# Patient Record
Sex: Female | Born: 1965 | Race: Black or African American | Hispanic: No | Marital: Single | Smoking: Current every day smoker
Health system: Southern US, Community
[De-identification: ages and names within clinical notes are randomized; demographics above are authoritative.]

## PROBLEM LIST (undated history)

## (undated) DIAGNOSIS — I10 Essential (primary) hypertension: Secondary | ICD-10-CM

## (undated) DIAGNOSIS — F32A Depression, unspecified: Secondary | ICD-10-CM

## (undated) DIAGNOSIS — F329 Major depressive disorder, single episode, unspecified: Secondary | ICD-10-CM

## (undated) DIAGNOSIS — M1990 Unspecified osteoarthritis, unspecified site: Secondary | ICD-10-CM

## (undated) HISTORY — DX: Major depressive disorder, single episode, unspecified: F32.9

## (undated) HISTORY — DX: Essential (primary) hypertension: I10

## (undated) HISTORY — DX: Unspecified osteoarthritis, unspecified site: M19.90

## (undated) HISTORY — DX: Depression, unspecified: F32.A

---

## 2001-12-28 HISTORY — PX: TUBAL LIGATION: SHX77

## 2012-01-09 ENCOUNTER — Encounter (HOSPITAL_COMMUNITY): Payer: Self-pay | Admitting: *Deleted

## 2012-01-09 ENCOUNTER — Emergency Department (INDEPENDENT_AMBULATORY_CARE_PROVIDER_SITE_OTHER)
Admission: EM | Admit: 2012-01-09 | Discharge: 2012-01-09 | Disposition: A | Discharge status: Home / Self Care | Payer: Self-pay | Source: Home / Self Care | Attending: Emergency Medicine | Admitting: Emergency Medicine

## 2012-01-09 DIAGNOSIS — I10 Essential (primary) hypertension: Secondary | ICD-10-CM

## 2012-01-09 MED ORDER — HYDROCHLOROTHIAZIDE 12.5 MG PO TABS: 25.0000 mg | ORAL_TABLET | Freq: Every day | ORAL | Status: DC

## 2012-01-09 MED ORDER — HYDROCHLOROTHIAZIDE 25 MG PO TABS: 25.0000 mg | ORAL_TABLET | Freq: Every day | ORAL | Status: DC

## 2012-01-09 NOTE — ED Notes (Signed)
Reports having high BP over last 2 days when attempting to go to [facility] to donate.  Denies hx HTN.  Has first appt at [facility] on 01/19/12.  C/O frequent HAs recently.

## 2012-01-09 NOTE — ED Provider Notes (Signed)
History     CSN: [id number]  Arrival date & time 01/09/12  1018   First MD Initiated Contact with Patient 01/09/12 1027      Chief Complaint  Patient presents with  . Hypertension    (Consider location/radiation/quality/duration/timing/severity/associated sxs/prior treatment) HPI Comments: [facility], that my blood pressure is high" I have an appointment to see a PCP, but didn't want to wait that long cause my BP has been high, have seen it 175/102 and more" ' i also been getting these  headaches"  Patient is a 46 y.o. female presenting with hypertension. The history is provided by the patient. No language interpreter was used.  Hypertension This is a recurrent problem. The problem occurs constantly. The problem has not changed since onset.Associated symptoms include headaches. Pertinent negatives include no chest pain, no abdominal pain and no shortness of breath. The symptoms are aggravated by nothing. The symptoms are relieved by nothing. She has tried nothing for the symptoms.    History reviewed. No pertinent past medical history.  History reviewed. No pertinent past surgical history.  No family history on file.  History  Substance Use Topics  . Smoking status: Current Everyday Smoker -- 1.0 packs/day  . Smokeless tobacco: Not on file  . Alcohol Use:     OB History    Grav Para Term Preterm Abortions TAB SAB Ect Mult Living                  Review of Systems  Constitutional: Negative for fever and fatigue.  Respiratory: Negative for shortness of breath.   Cardiovascular: Negative for chest pain.  Gastrointestinal: Negative for abdominal pain.  Neurological: Positive for headaches. Negative for dizziness, syncope, facial asymmetry, speech difficulty, weakness, light-headedness and numbness.    Allergies  Review of patient's allergies indicates no known allergies.  Home Medications   Current Outpatient Rx  Name Route Sig  Dispense Refill  . HYDROCHLOROTHIAZIDE 25 MG PO TABS Oral Take 1 tablet (25 mg total) by mouth daily. 30 tablet 0    BP 159/99  Pulse 89  Temp(Src) 98 F (36.7 C) (Oral)  Resp 16  SpO2 100%  Physical Exam  Nursing note and vitals reviewed. Constitutional: She appears well-developed and well-nourished. No distress.  HENT:  Head: Normocephalic.  Eyes: Conjunctivae are normal.  Neck: Normal range of motion. Neck supple. No JVD present.  Cardiovascular: Normal rate, regular rhythm, normal heart sounds and intact distal pulses.  Exam reveals no gallop and no friction rub.   No murmur heard. Pulmonary/Chest: Effort normal and breath sounds normal. No respiratory distress. She has no decreased breath sounds. She has no wheezes. She has no rhonchi. She has no rales. She exhibits no tenderness.  Abdominal: Soft. Bowel sounds are normal. She exhibits no distension and no mass. There is no tenderness. There is no rebound and no guarding.  Lymphadenopathy:    She has no cervical adenopathy.  Neurological: She is alert. She has normal strength. She displays no atrophy and no tremor. No cranial nerve deficit or sensory deficit. She exhibits normal muscle tone.    ED Course  Procedures (including critical care time)  Labs Reviewed - No data to display No results found.   1. Hypertension       MDM  Recurrent HTN        Jimmie Molly, MD 01/09/12 1501

## 2012-03-22 ENCOUNTER — Encounter: Payer: Self-pay | Admitting: Gastroenterology

## 2012-03-22 ENCOUNTER — Other Ambulatory Visit (HOSPITAL_COMMUNITY): Payer: Self-pay | Admitting: Family Medicine

## 2012-03-22 DIAGNOSIS — N939 Abnormal uterine and vaginal bleeding, unspecified: Secondary | ICD-10-CM

## 2012-04-01 ENCOUNTER — Other Ambulatory Visit (HOSPITAL_COMMUNITY): Payer: Self-pay

## 2012-04-01 ENCOUNTER — Inpatient Hospital Stay (HOSPITAL_COMMUNITY): Admission: RE | Admit: 2012-04-01 | Payer: Self-pay | Source: Ambulatory Visit

## 2012-04-05 ENCOUNTER — Ambulatory Visit (HOSPITAL_COMMUNITY)
Admission: RE | Admit: 2012-04-05 | Discharge: 2012-04-05 | Disposition: A | Discharge status: Home / Self Care | Payer: Self-pay | Source: Ambulatory Visit | Attending: Family Medicine | Admitting: Family Medicine

## 2012-04-05 ENCOUNTER — Ambulatory Visit (AMBULATORY_SURGERY_CENTER): Payer: Self-pay | Admitting: *Deleted

## 2012-04-05 VITALS — Ht 65.5 in | Wt 170.3 lb

## 2012-04-05 DIAGNOSIS — N950 Postmenopausal bleeding: Secondary | ICD-10-CM | POA: Insufficient documentation

## 2012-04-05 DIAGNOSIS — Z1211 Encounter for screening for malignant neoplasm of colon: Secondary | ICD-10-CM

## 2012-04-05 DIAGNOSIS — N939 Abnormal uterine and vaginal bleeding, unspecified: Secondary | ICD-10-CM

## 2012-04-05 DIAGNOSIS — N83209 Unspecified ovarian cyst, unspecified side: Secondary | ICD-10-CM | POA: Insufficient documentation

## 2012-04-05 MED ORDER — PEG-KCL-NACL-NASULF-NA ASC-C 100 G PO SOLR: ORAL | Status: DC

## 2012-04-05 NOTE — Progress Notes (Signed)
Information on how to pay her pill and Moviprep voucher given to pt

## 2012-04-19 ENCOUNTER — Encounter: Payer: Self-pay | Admitting: Gastroenterology

## 2012-04-27 ENCOUNTER — Ambulatory Visit (INDEPENDENT_AMBULATORY_CARE_PROVIDER_SITE_OTHER): Payer: Self-pay | Admitting: Obstetrics & Gynecology

## 2012-04-27 ENCOUNTER — Encounter: Payer: Self-pay | Admitting: Obstetrics & Gynecology

## 2012-04-27 ENCOUNTER — Other Ambulatory Visit (HOSPITAL_COMMUNITY)
Admission: RE | Admit: 2012-04-27 | Discharge: 2012-04-27 | Disposition: A | Discharge status: Home / Self Care | Payer: Self-pay | Source: Ambulatory Visit | Attending: Obstetrics & Gynecology | Admitting: Obstetrics & Gynecology

## 2012-04-27 VITALS — BP 142/93 | HR 71 | Temp 97.5°F | Resp 20 | Ht 65.5 in | Wt 173.6 lb

## 2012-04-27 DIAGNOSIS — D219 Benign neoplasm of connective and other soft tissue, unspecified: Secondary | ICD-10-CM

## 2012-04-27 DIAGNOSIS — N926 Irregular menstruation, unspecified: Secondary | ICD-10-CM

## 2012-04-27 DIAGNOSIS — N939 Abnormal uterine and vaginal bleeding, unspecified: Secondary | ICD-10-CM | POA: Insufficient documentation

## 2012-04-27 DIAGNOSIS — Z01812 Encounter for preprocedural laboratory examination: Secondary | ICD-10-CM

## 2012-04-27 DIAGNOSIS — D259 Leiomyoma of uterus, unspecified: Secondary | ICD-10-CM

## 2012-04-27 NOTE — Progress Notes (Signed)
Addended by: Jaynie Collins A on: 04/27/2012 05:13 PM   Modules accepted: Orders

## 2012-04-27 NOTE — Patient Instructions (Signed)
Postmenopausal Bleeding Menopause is commonly referred to as the "change in life." It is a time when the fertile years, the time of ovulating and having menstrual periods, has come to an end. It is also determined by not having menstrual periods for 12 months.  Postmenopausal bleeding is any bleeding a woman has after she has entered into menopause. Any type of postmenopausal bleeding, even if it appears to be a typical menstrual period, is concerning. This should be evaluated by your caregiver.  CAUSES   Hormone therapy.   Cancer of the cervix or cancer of the lining of the uterus (endometrial cancer).   Thinning of the uterine lining (uterine atrophy).   Thyroid diseases.   Certain medicines.   Infection of the uterus or cervix.   Inflammation or irritation of the uterine lining (endometritis).   Estrogen-secreting tumors.   Growths (polyps) on the cervix, uterine lining, or uterus.   Uterine tumors (fibroids).   Being very overweight (obese).  DIAGNOSIS  Your caregiver will take a medical history and ask questions. A physical exam will also be performed. Further tests may include:   A transvaginal ultrasound. An ultrasound wand or probe is inserted into your vagina to view the pelvic organs.   A biopsy of the lining of the uterus (endometrium). A sample of the endometrium is removed and examined.   A hysteroscopy. Your caregiver may use an instrument with a light and a camera attached to it (hysteroscope). The hysteroscope is used to look inside the uterus for problems.   A dilation and curettage (D&C). Tissue is removed from the uterine lining to be examined for problems.  TREATMENT  Treatment depends on the cause of the bleeding. Some treatments include:   Surgery.   Medicines.   Hormones.   A hysteroscopy or D&C to remove polyps or fibroids.   Changing or stopping a current medicine you are taking.  Talk to your caregiver about your specific treatment. HOME CARE  INSTRUCTIONS   Maintain a healthy weight.   Keep regular pelvic exams and Pap tests.  SEEK MEDICAL CARE IF:   You have bleeding, even if it is light in comparison to your previous periods.   Your bleeding lasts more than 1 week.   You have abdominal pain.   You develop bleeding with sexual intercourse.  SEEK IMMEDIATE MEDICAL CARE IF:   You have a fever, chills, headache, dizziness, muscle aches, and bleeding.   You have severe pain with bleeding.   You are passing blood clots.   You have bleeding and need more than 1 pad an hour.   You feel faint.  MAKE SURE YOU:  Understand these instructions.   Will watch your condition.   Will get help right away if you are not doing well or get worse.  Document Released: 03/24/2006 Document Revised: 12/03/2011 Document Reviewed: 08/20/2011 [employer] Patient Information 2012 [employer]. 

## 2012-04-27 NOTE — Progress Notes (Signed)
.  Endometrial Biopsy Procedure Note  History of Present Illness:Patient reports having one year without bleeding, then period resumed in 02/2011 and lasted 7 days.  In April, just had spotting.  No other symptoms. Wants to make sure everything is okay.  04/05/2012 TRANSABDOMINAL AND TRANSVAGINAL ULTRASOUND OF PELVIS Clinical Data: Postmenopausal bleeding. LMP was greater than 1 year ago.  Findings:  Uterus: Is anteverted and measures 8.6 x 4.4 x 4.5 cm.  The uterus is diffusely heterogeneous.  In the left lower uterine segment is an intramural 2.2 x 1.6 x 1.6 cm fibroid.  In the right aspect of the uterine fundus is a 2.6 x 2.3 x 1.9 cm uterine fibroid.  There is diffuse heterogeneity in the posterior uterine body which is more ill-defined and is associated with some linear shadowing. This area measures 3.7 x 2.7 x 3.7 cm and could reflect a fibroid or focal adenomyosis.  The anterior margin of this abnormality is submucosal.  Endometrium: The endometrium is not well visualized.  The junctional zone between the endometrium and myometrium is indistinct and shadowing from the fibroids and/or adenomyosis makes evaluation difficult.  The endometrium is estimated to be between 5 and 8 mm.  Right ovary:  The right ovary measures 2.3 x 2.0 x 1.0 cm and contains a 1.6 x 1.6 x 2.1 cm long unilocular simple cyst versus dominant follicle.  Left ovary: The left ovary measures 2.7 x 1.7 x 1.6 cm and contains a complex cyst with thin internal bands that measures 1.4 x 1.0 x 1.3 cm.  Other findings: No free fluid  IMPRESSION: 1. The uterus contains at least two distinct fibroids as well as a larger indistinct heterogeneous area in the posterior uterine body with a submucosal component; fibroid versus focal adenomyosis. 2.  Precise measurements of the endometrium are difficult, as described above.  Endometrial thickness is estimated to be between 5-8 mm.  3.  1.6 cm unilocular cyst in the right ovary.  4.  1.4 cm mildly complex  cyst in the left ovary. This complex cyst has appearance suggestive of a hemorrhagic cyst.  A follow-up pelvic ultrasound in 3 months is suggested.  Original Report Authenticated By: Britta Mccreedy, M.D.   Indications: abnormal uterine bleeding, postmenopausal bleeding  Procedure Details  Urine pregnancy test was done today and result was negative.  The risks (including infection, bleeding, pain, and uterine perforation) and benefits of the procedure were explained to the patient and Written informed consent was obtained.  The patient was placed in the dorsal lithotomy position.  Bimanual exam showed the uterus to be in the anteroflexed position.  A Graves' speculum inserted in the vagina, and the cervix prepped with povidone iodine. A sharp tenaculum was applied to the anterior lip of the cervix for stabilization.  A sterile uterine sound was used to sound the uterus to a depth of 7cm.  A Mylex 3mm curette was used to sample the endometrium.  Sample was sent for pathologic examination.  Condition: Stable  Complications: None  Plan: The patient was advised to call for any fever or for prolonged or severe pain or bleeding. She was advised to use NSAID as needed for mild to moderate pain. She was advised to avoid vaginal intercourse for 48 hours or until the bleeding has completely stopped.  She was return to discuss results. Bleeding precautions reviewed.

## 2012-05-04 ENCOUNTER — Other Ambulatory Visit: Payer: Self-pay | Admitting: Obstetrics & Gynecology

## 2012-05-04 ENCOUNTER — Ambulatory Visit (HOSPITAL_COMMUNITY)
Admission: RE | Admit: 2012-05-04 | Discharge: 2012-05-04 | Disposition: A | Discharge status: Home / Self Care | Payer: Self-pay | Source: Ambulatory Visit | Attending: Obstetrics & Gynecology | Admitting: Obstetrics & Gynecology

## 2012-05-04 DIAGNOSIS — Z1231 Encounter for screening mammogram for malignant neoplasm of breast: Secondary | ICD-10-CM

## 2012-05-06 ENCOUNTER — Encounter: Payer: Self-pay | Admitting: Gastroenterology

## 2012-05-11 ENCOUNTER — Telehealth: Payer: Self-pay | Admitting: *Deleted

## 2012-05-11 NOTE — Telephone Encounter (Signed)
Lailani called and left a message requesting results- called [facility]- had endometrial biopsy for postmenopausal bleeding- informed her per chart review Dr. Macon Large had planned for a results appointment- but did tell patient her biopsy was negative for cancer, but may still need to meet with doctor to discuss plan of care.  Patient also c/o still spotting and pain in ovary where another doctor told her was filled with blood per ultrasound- informed her would need to schedule appointment to discuss plan of care and results- will send to front office to schedule appointment. Patient requests  Friday am appt. Patient voices understanding.

## 2012-05-13 ENCOUNTER — Telehealth: Payer: Self-pay | Admitting: *Deleted

## 2012-05-13 NOTE — Telephone Encounter (Signed)
Called Mkenzie and gave her the appt and she agreed this was acceptable.  No concerns voiced

## 2012-05-13 NOTE — Telephone Encounter (Signed)
Message copied by Gerome Apley on Fri May 13, 2012 11:56 AM ------      Message from: Elwin Sleight A      Created: Wed May 11, 2012  4:25 PM       Made appointment for first available she had. Can you call patient? She may have questions. Appointment so far away.            Thank you,      Sherry Ruffing

## 2012-06-08 ENCOUNTER — Telehealth: Payer: Self-pay | Admitting: *Deleted

## 2012-06-08 DIAGNOSIS — N939 Abnormal uterine and vaginal bleeding, unspecified: Secondary | ICD-10-CM

## 2012-06-08 NOTE — Telephone Encounter (Signed)
Called pt and informed her of benign endometrial biopsy results. I also advised her to call us if her bleeding recurs or for any other Gyn concerns. Pt voiced understanding.

## 2012-06-08 NOTE — Telephone Encounter (Signed)
Please call patient to inform her that her endometrial biopsy is benign.  No further intervention needed for now.  Patient should call if bleeding recurs or for any GYN concerns.

## 2012-06-08 NOTE — Telephone Encounter (Signed)
Pt left message stating that she has clinic appt on 6/13 @ 1245 for results and to discuss plan of care. She has started a new job and will not be able to keep the appt. She would like the doctor to call her with the results. She can be reached after 1530.  Call info routed to Dr. Macon Large.

## 2012-06-09 ENCOUNTER — Ambulatory Visit: Payer: Self-pay | Admitting: Obstetrics & Gynecology

## 2013-04-28 ENCOUNTER — Other Ambulatory Visit: Payer: Self-pay | Admitting: Internal Medicine

## 2013-04-28 DIAGNOSIS — Z1231 Encounter for screening mammogram for malignant neoplasm of breast: Secondary | ICD-10-CM

## 2013-06-06 ENCOUNTER — Ambulatory Visit
Admission: RE | Admit: 2013-06-06 | Discharge: 2013-06-06 | Disposition: A | Discharge status: Home / Self Care | Payer: BC Managed Care – PPO | Source: Ambulatory Visit | Attending: Internal Medicine | Admitting: Internal Medicine

## 2013-06-06 DIAGNOSIS — Z1231 Encounter for screening mammogram for malignant neoplasm of breast: Secondary | ICD-10-CM

## 2014-06-06 ENCOUNTER — Emergency Department (INDEPENDENT_AMBULATORY_CARE_PROVIDER_SITE_OTHER): Payer: PRIVATE HEALTH INSURANCE

## 2014-06-06 ENCOUNTER — Encounter (HOSPITAL_COMMUNITY): Payer: Self-pay | Admitting: Emergency Medicine

## 2014-06-06 ENCOUNTER — Emergency Department (INDEPENDENT_AMBULATORY_CARE_PROVIDER_SITE_OTHER)
Admission: EM | Admit: 2014-06-06 | Discharge: 2014-06-06 | Disposition: A | Discharge status: Home / Self Care | Payer: PRIVATE HEALTH INSURANCE | Source: Home / Self Care

## 2014-06-06 DIAGNOSIS — M25579 Pain in unspecified ankle and joints of unspecified foot: Secondary | ICD-10-CM

## 2014-06-06 DIAGNOSIS — M25476 Effusion, unspecified foot: Secondary | ICD-10-CM

## 2014-06-06 DIAGNOSIS — M25572 Pain in left ankle and joints of left foot: Secondary | ICD-10-CM

## 2014-06-06 DIAGNOSIS — M25472 Effusion, left ankle: Secondary | ICD-10-CM

## 2014-06-06 DIAGNOSIS — M25473 Effusion, unspecified ankle: Secondary | ICD-10-CM

## 2014-06-06 LAB — C-REACTIVE PROTEIN: CRP: <0.5 mg/dL — ABNORMAL LOW (ref <0.60)

## 2014-06-06 LAB — CBC WITH DIFFERENTIAL/PLATELET
Basophils Absolute: 0 10*3/uL (ref 0.0–0.1)
Basophils Relative: 0 % (ref 0–1)
EOS ABS: 0.1 10*3/uL (ref 0.0–0.7)
EOS PCT: 2 % (ref 0–5)
HEMATOCRIT: 38.5 % (ref 36.0–46.0)
Hemoglobin: 12.1 g/dL (ref 12.0–15.0)
LYMPHS ABS: 2.7 10*3/uL (ref 0.7–4.0)
LYMPHS PCT: 46 % (ref 12–46)
MCH: 26.9 pg (ref 26.0–34.0)
MCHC: 31.4 g/dL (ref 30.0–36.0)
MCV: 85.6 fL (ref 78.0–100.0)
MONO ABS: 0.5 10*3/uL (ref 0.1–1.0)
Monocytes Relative: 8 % (ref 3–12)
Neutro Abs: 2.6 10*3/uL (ref 1.7–7.7)
Neutrophils Relative %: 44 % (ref 43–77)
Platelets: 315 10*3/uL (ref 150–400)
RBC: 4.5 MIL/uL (ref 3.87–5.11)
RDW: 16.7 % — AB (ref 11.5–15.5)
WBC: 5.9 10*3/uL (ref 4.0–10.5)

## 2014-06-06 LAB — SEDIMENTATION RATE: SED RATE: 8 mm/h (ref 0–22)

## 2014-06-06 LAB — URIC ACID: Uric Acid, Serum: 4.9 mg/dL (ref 2.4–7.0)

## 2014-06-06 MED ORDER — KETOROLAC TROMETHAMINE 60 MG/2ML IM SOLN
60.0000 mg | Freq: Once | INTRAMUSCULAR | Status: AC
  Administered 2014-06-06: 60 mg via INTRAMUSCULAR

## 2014-06-06 MED ORDER — KETOROLAC TROMETHAMINE 60 MG/2ML IM SOLN
INTRAMUSCULAR | Status: AC
  Filled 2014-06-06: qty 2

## 2014-06-06 NOTE — ED Provider Notes (Signed)
CSN: [id number]     Arrival date & time 06/06/14  1013 History   None    Chief Complaint  Patient presents with  . Ankle Pain   (Consider location/radiation/quality/duration/timing/severity/associated sxs/prior Treatment) HPI  L ankle pain: ongoing for 4 wks. Getting worse. Throbbing. Stands for prolonged periods at work. Wakes up from sleep. Worse w/ ambulation. Advil 200 w/ only mild benefit. Denies any fevers, diffuse arthralgias. Denies recent or historical trauma to the L ankle. Tried 2x sock and ankle bfrace w/o benefit.   Past Medical History  Diagnosis Date  . Arthritis   . Depression   . Hypertension    Past Surgical History  Procedure Laterality Date  . Tubal ligation  2003   Family History  Problem Relation Age of Onset  . Colon cancer Neg Hx   . Esophageal cancer Neg Hx   . Rectal cancer Neg Hx   . Stomach cancer Neg Hx   . Hypertension Mother   . Cancer Father     prostate  . Hypertension Father   . Hypertension Sister    History  Substance Use Topics  . Smoking status: Current Every Day Smoker -- 0.50 packs/day for 20 years    Types: Cigarettes  . Smokeless tobacco: Not on file     Comment: taking Wellbutrin to try to help stop smoking  . Alcohol Use: 0.0 oz/week     Comment: socially   OB History   Grav Para Term Preterm Abortions TAB SAB Ect Mult Living                 Review of Systems  Musculoskeletal: Positive for joint swelling.  All other systems reviewed and are negative.   Allergies  Review of patient's allergies indicates no known allergies.  Home Medications   Prior to Admission medications   Medication Sig Start Date End Date Taking? Authorizing Provider  lisinopril-hydrochlorothiazide (PRINZIDE,ZESTORETIC) 20-12.5 MG per tablet Take 1 tablet by mouth daily.   Yes Historical Provider, MD  buPROPion (WELLBUTRIN SR) 150 MG 12 hr tablet Take 150 mg by mouth 2 (two) times daily.    Historical Provider, MD  CALCIUM PO Take 600 mg by  mouth daily.    Historical Provider, MD  hydrochlorothiazide (HYDRODIURIL) 25 MG tablet Take 1 tablet (25 mg total) by mouth daily. 01/09/12 01/08/13  Rosana Hoes, MD  Naproxen (NAPROSYN PO) Take by mouth as needed.    Historical Provider, MD  peg 3350 powder (MOVIPREP) SOLR Moviprep as directed 04/05/12   Sable Feil, MD   BP 156/103  Pulse 63  Temp(Src) 97.6 F (36.4 C) (Oral)  Resp 16  SpO2 99%  LMP 03/09/2012 Physical Exam  Constitutional: She appears well-developed and well-nourished. No distress.  Eyes: EOM are normal.  Neck: Normal range of motion.  Cardiovascular: Normal rate.   Pulmonary/Chest: Effort normal. No respiratory distress.  Abdominal: She exhibits no distension.  Musculoskeletal:  FROM ankles bilat.  No joint instability L ankle effusion. Mild diffuse ttp of L ankle 2+ pulses distally  Skin: Skin is warm. No rash noted. She is not diaphoretic. No erythema. No pallor.  Psychiatric: She has a normal mood and affect. Her behavior is normal. Judgment and thought content normal.    ED Course  Procedures (including critical care time) Labs Review Labs Reviewed  CBC WITH DIFFERENTIAL - Abnormal; Notable for the following:    RDW 16.7 (*)    All other components within normal limits  SEDIMENTATION RATE  C-REACTIVE  PROTEIN  URIC ACID    Imaging Review Dg Ankle Complete Left  06/06/2014   CLINICAL DATA:  Pain and swelling of left ankle.  EXAM: LEFT ANKLE COMPLETE - 3+ VIEW  COMPARISON:  None.  FINDINGS: There is no evidence of fracture, dislocation, or joint effusion. Small plantar heel spur noted. There is no evidence of arthropathy or other focal bone abnormality. There is mild diffuse soft tissue swelling.  IMPRESSION: 1. Diffuse soft tissue swelling. 2. No acute bone abnormality.   Electronically Signed   By: Kerby Moors M.D.   On: 06/06/2014 13:39     MDM   1. Ankle pain, left   2. Swollen L ankle    Plain film fairly unremarkable  Gout vs  osteoarthritis. Sed rate, CRP, Uric Acid, and CBC sent. Unlikely to be septic or acute injury related. Toradol 60mg  given in office, with improvement. Continue NSAID regimen at home in 24 hrs. F/u PCP for further mgt at that time. Pt out given note for lite duty  (sitting only) at work the remainder of this week. Precautions given and all questions answered  Linna Darner, MD Family Medicine PGY-3 06/06/2014, 1:57 PM      Waldemar Dickens, MD 06/06/14 [place], MD 06/06/14 1357

## 2014-06-06 NOTE — Discharge Instructions (Signed)
The cause of your swollen ankle is not clear, but it is likely related to either gout or arthritis.  We will call you if any of your lab work comes back abnormal Please take tylenol 1gram every 8 hours as needed. This can be taken on top of the ibuprofen Please start Advil (600-800mg ) every 6-8 hours. Start this tomorrow at lunch time Please follow up with your routine physician

## 2014-06-06 NOTE — ED Provider Notes (Signed)
Medical screening examination/treatment/procedure(s) were performed by a resident physician or non-physician practitioner and as the supervising physician I was immediately available for consultation/collaboration.  Lynne Leader, MD    Gregor Hams, MD 06/06/14 [phone]

## 2014-06-06 NOTE — ED Notes (Addendum)
Left ankle/foot pain.  Swelling to left ankle.  No known injury.  Symptoms started a month ago, gradually increasing in pain.  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  Pedal pulse 2 plus

## 2014-06-12 ENCOUNTER — Telehealth (HOSPITAL_COMMUNITY): Payer: Self-pay | Admitting: *Deleted

## 2014-06-12 NOTE — ED Notes (Signed)
Pt. called for her lab results.  Pt. verified x 2 and given results.  Pt. told we did not call her because they were all neg.  She said she still has some pain and swelling, taking Advil. She was worried that it was gout.  I told her that her uric acid level is normal.  Advised pt. to call her PCP for a follow-up. Roselyn Meier 06/12/2014

## 2015-01-09 ENCOUNTER — Encounter (HOSPITAL_COMMUNITY): Payer: Self-pay | Admitting: *Deleted

## 2015-01-09 ENCOUNTER — Emergency Department (HOSPITAL_COMMUNITY)
Admission: EM | Admit: 2015-01-09 | Discharge: 2015-01-09 | Disposition: A | Discharge status: Home / Self Care | Payer: 59 | Attending: Emergency Medicine | Admitting: Emergency Medicine

## 2015-01-09 DIAGNOSIS — F329 Major depressive disorder, single episode, unspecified: Secondary | ICD-10-CM | POA: Diagnosis not present

## 2015-01-09 DIAGNOSIS — Z720 Tobacco use: Secondary | ICD-10-CM | POA: Insufficient documentation

## 2015-01-09 DIAGNOSIS — M1990 Unspecified osteoarthritis, unspecified site: Secondary | ICD-10-CM | POA: Insufficient documentation

## 2015-01-09 DIAGNOSIS — Y9289 Other specified places as the place of occurrence of the external cause: Secondary | ICD-10-CM | POA: Diagnosis not present

## 2015-01-09 DIAGNOSIS — Z79899 Other long term (current) drug therapy: Secondary | ICD-10-CM | POA: Diagnosis not present

## 2015-01-09 DIAGNOSIS — Y9389 Activity, other specified: Secondary | ICD-10-CM | POA: Insufficient documentation

## 2015-01-09 DIAGNOSIS — T783XXA Angioneurotic edema, initial encounter: Secondary | ICD-10-CM

## 2015-01-09 DIAGNOSIS — R220 Localized swelling, mass and lump, head: Secondary | ICD-10-CM | POA: Diagnosis present

## 2015-01-09 DIAGNOSIS — T450X5A Adverse effect of antiallergic and antiemetic drugs, initial encounter: Secondary | ICD-10-CM | POA: Diagnosis not present

## 2015-01-09 DIAGNOSIS — Y998 Other external cause status: Secondary | ICD-10-CM | POA: Diagnosis not present

## 2015-01-09 DIAGNOSIS — I10 Essential (primary) hypertension: Secondary | ICD-10-CM | POA: Diagnosis not present

## 2015-01-09 DIAGNOSIS — T7840XA Allergy, unspecified, initial encounter: Secondary | ICD-10-CM

## 2015-01-09 MED ORDER — DIPHENHYDRAMINE HCL 25 MG PO CAPS
25.0000 mg | ORAL_CAPSULE | Freq: Once | ORAL | Status: AC
  Administered 2015-01-09: 25 mg via ORAL
  Filled 2015-01-09: qty 1

## 2015-01-09 MED ORDER — AMLODIPINE BESYLATE 10 MG PO TABS: 10.0000 mg | ORAL_TABLET | Freq: Every day | ORAL | Status: DC

## 2015-01-09 MED ORDER — PREDNISONE 20 MG PO TABS: 40.0000 mg | ORAL_TABLET | Freq: Every day | ORAL | Status: DC

## 2015-01-09 MED ORDER — METHYLPREDNISOLONE SODIUM SUCC 125 MG IJ SOLR
125.0000 mg | Freq: Once | INTRAMUSCULAR | Status: AC
  Administered 2015-01-09: 125 mg via INTRAMUSCULAR
  Filled 2015-01-09: qty 2

## 2015-01-09 MED ORDER — FAMOTIDINE 20 MG PO TABS
20.0000 mg | ORAL_TABLET | Freq: Once | ORAL | Status: AC
  Administered 2015-01-09: 20 mg via ORAL
  Filled 2015-01-09: qty 1

## 2015-01-09 MED ORDER — LORATADINE 10 MG PO TABS: 10.0000 mg | ORAL_TABLET | Freq: Every day | ORAL | Status: DC

## 2015-01-09 NOTE — Discharge Instructions (Signed)
Please follow up with your primary care physician in 1-2 days. If you do not have one please call the [place] number listed above. Please discontinue the use of your lisinopril. Please start the norvasc or discuss alternative blood pressure medications with your primary care physician. Please take the other medications as prescribed. Please read all discharge instructions and return precautions.    Angioedema Angioedema is a sudden swelling of tissues, often of the skin. It can occur on the face or genitals or in the abdomen or other body parts. The swelling usually develops over a short period and gets better in 24 to 48 hours. It often begins during the night and is found when the person wakes up. The person may also get red, itchy patches of skin (hives). Angioedema can be dangerous if it involves swelling of the air passages.  Depending on the cause, episodes of angioedema may only happen once, come back in unpredictable patterns, or repeat for several years and then gradually fade away.  CAUSES  Angioedema can be caused by an allergic reaction to various triggers. It can also result from nonallergic causes, including reactions to drugs, immune system disorders, viral infections, or an abnormal gene that is passed to you from your parents (hereditary). For some people with angioedema, the cause is unknown.  Some things that can trigger angioedema include:   Foods.   Medicines, such as ACE inhibitors, ARBs, nonsteroidal anti-inflammatory agents, or estrogen.   Latex.   Animal saliva.   Insect stings.   Dyes used in X-rays.   Mild injury.   Dental work.  Surgery.  Stress.   Sudden changes in temperature.   Exercise. SIGNS AND SYMPTOMS   Swelling of the skin.  Hives. If these are present, there is also intense itching.  Redness in the affected area.   Pain in the affected area.  Swollen lips or tongue.  Breathing problems. This may  happen if the air passages swell.  Wheezing. If internal organs are involved, there may be:   Nausea.   Abdominal pain.   Vomiting.   Difficulty swallowing.   Difficulty passing urine. DIAGNOSIS   Your health care provider will examine the affected area and take a medical and family history.  Various tests may be done to help determine the cause. Tests may include:  Allergy skin tests to see if the problem is an allergic reaction.   Blood tests to check for hereditary angioedema.   Tests to check for underlying diseases that could cause the condition.   A review of your medicines, including over-the-counter medicines, may be done. TREATMENT  Treatment will depend on the cause of the angioedema. Possible treatments include:   Removal of anything that triggered the condition (such as stopping certain medicines).   Medicines to treat symptoms or prevent attacks. Medicines given may include:   Antihistamines.   Epinephrine injection.   Steroids.   Hospitalization may be required for severe attacks. If the air passages are affected, it can be an emergency. Tubes may need to be placed to keep the airway open. HOME CARE INSTRUCTIONS   Take all medicines as directed by your health care provider.  If you were given medicines for emergency allergy treatment, always carry them with you.  Wear a medical bracelet as directed by your health care provider.   Avoid known triggers. SEEK MEDICAL CARE IF:   You have repeat attacks of angioedema.   Your attacks are more frequent or more  severe despite preventive measures.   You have hereditary angioedema and are considering having children. It is important to discuss with your health care provider the risks of passing the condition on to your children. SEEK IMMEDIATE MEDICAL CARE IF:   You have severe swelling of the mouth, tongue, or lips.  You have difficulty breathing.   You have difficulty swallowing.    You faint. MAKE SURE YOU:  Understand these instructions.  Will watch your condition.  Will get help right away if you are not doing well or get worse. Document Released: 02/22/2002 Document Revised: 04/30/2014 Document Reviewed: 08/07/2013 [facility] Patient Information 2015 [place], [place]. This information is not intended to replace advice given to you by your health care provider. Make sure you discuss any questions you have with your health care provider.

## 2015-01-09 NOTE — ED Notes (Signed)
Pt reports taking lisinopril in past, just started taking it again on Monday and now has upper lip swelling that started yesterday, took one benadryl with no relief. Airway is intact at this time.

## 2015-01-09 NOTE — ED Provider Notes (Signed)
CSN: [id number]     Arrival date & time 01/09/15  1343 History   First MD Initiated Contact with Patient 01/09/15 1600     Chief Complaint  Patient presents with  . Allergic Reaction     (Consider location/radiation/quality/duration/timing/severity/associated sxs/prior Treatment) HPI Comments: Patient is a 49 yo F PMHx significant for Depression, HTN, Tobacco abuse presenting to the ED for upper lip swelling that has been gradually worsening since yesterday. Patient states that she was re-started on Lisinopril on Monday by her PCP. Patient states she took one Benadryl today with no improvement. Patient denies any tongue swelling, rash, SOB, nausea, vomiting, abdominal pain, diarrhea, CP. No allergies to anything else.   Patient is a 49 y.o. female presenting with allergic reaction.  Allergic Reaction   Past Medical History  Diagnosis Date  . Arthritis   . Depression   . Hypertension    Past Surgical History  Procedure Laterality Date  . Tubal ligation  2003   Family History  Problem Relation Age of Onset  . Colon cancer Neg Hx   . Esophageal cancer Neg Hx   . Rectal cancer Neg Hx   . Stomach cancer Neg Hx   . Hypertension Mother   . Cancer Father     prostate  . Hypertension Father   . Hypertension Sister    History  Substance Use Topics  . Smoking status: Current Every Day Smoker -- 0.50 packs/day for 20 years    Types: Cigarettes  . Smokeless tobacco: Not on file     Comment: taking Wellbutrin to try to help stop smoking  . Alcohol Use: 0.0 oz/week     Comment: socially   OB History    No data available     Review of Systems  HENT: Positive for facial swelling.   All other systems reviewed and are negative.     Allergies  Review of patient's allergies indicates no known allergies.  Home Medications   Prior to Admission medications   Medication Sig Start Date End Date Taking? Authorizing Provider  amLODipine (NORVASC) 10 MG tablet Take 1 tablet (10 mg  total) by mouth daily. 01/09/15   Niccole Witthuhn L Fleda Pagel, PA-C  buPROPion (WELLBUTRIN SR) 150 MG 12 hr tablet Take 150 mg by mouth 2 (two) times daily.    Historical Provider, MD  CALCIUM PO Take 600 mg by mouth daily.    Historical Provider, MD  hydrochlorothiazide (HYDRODIURIL) 25 MG tablet Take 1 tablet (25 mg total) by mouth daily. 01/09/12 01/08/13  Rosana Hoes, MD  loratadine (CLARITIN) 10 MG tablet Take 1 tablet (10 mg total) by mouth daily. 01/09/15   Daleyza Gadomski L Cortny Bambach, PA-C  Naproxen (NAPROSYN PO) Take by mouth as needed.    Historical Provider, MD  peg 3350 powder (MOVIPREP) SOLR Moviprep as directed 04/05/12   Sable Feil, MD  predniSONE (DELTASONE) 20 MG tablet Take 2 tablets (40 mg total) by mouth daily. 01/09/15   Orli Degrave L Raoul Ciano, PA-C   BP 130/72 mmHg  Pulse 73  Temp(Src) 98.4 F (36.9 C) (Oral)  Resp 22  SpO2 96%  LMP 03/09/2012 Physical Exam  Constitutional: She is oriented to person, place, and time. She appears well-developed and well-nourished. No distress.  HENT:  Head: Normocephalic and atraumatic.  Right Ear: External ear normal.  Left Ear: External ear normal.  Nose: Nose normal.  Mouth/Throat: Oropharynx is clear and moist. No oropharyngeal exudate.  Upper lip swelling.  No posterior oropharyngeal edema.   Eyes: Conjunctivae  are normal.  Neck: Normal range of motion. Neck supple.  Cardiovascular: Normal rate, regular rhythm and normal heart sounds.   Pulmonary/Chest: Effort normal and breath sounds normal. No respiratory distress. She has no wheezes.  Abdominal: Soft. There is no tenderness.  Musculoskeletal: Normal range of motion. She exhibits no edema.  Neurological: She is alert and oriented to person, place, and time.  Skin: Skin is warm and dry. No rash noted. She is not diaphoretic.  Psychiatric: She has a normal mood and affect.  Nursing note and vitals reviewed.   ED Course  Procedures (including critical care time) Medications   diphenhydrAMINE (BENADRYL) capsule 25 mg (25 mg Oral Given 01/09/15 1609)  famotidine (PEPCID) tablet 20 mg (20 mg Oral Given 01/09/15 1609)  methylPREDNISolone sodium succinate (SOLU-MEDROL) 125 mg/2 mL injection 125 mg (125 mg Intramuscular Given 01/09/15 1609)    Labs Review Labs Reviewed - No data to display  Imaging Review No results found.   EKG Interpretation None      5:48 PM On re-evaluation patient without worsening of angioedema. No shortness of breath, wheezing, rash, sensation throat is closing, nausea, vomiting, abdominal pain, diarrhea.   MDM   Final diagnoses:  Angioedema of lips, initial encounter  Allergic reaction caused by a drug    Filed Vitals:   01/09/15 1700  BP: 130/72  Pulse: 73  Temp:   Resp:    Afebrile, NAD, non-toxic appearing, AAOx4. Patient monitored in ED for four hours without worsening of symptoms or changes in symptoms. Patient re-evaluated prior to dc, is hemodynamically stable, in no respiratory distress, and denies the feeling of throat closing. Pt has been advised to take OTC benadryl & return to the ED if they have a mod-severe allergic rxn (s/s including throat closing, difficulty breathing, swelling of lips face or tongue). Pt is to follow up with their PCP. Pt is agreeable with plan & verbalizes understanding. Patient is stable at time of discharge      Harlow Mares, PA-C 01/09/15 Jennet Maduro, MD 01/09/15 2010

## 2015-01-09 NOTE — ED Notes (Signed)
EDP at bedside  

## 2015-05-23 ENCOUNTER — Ambulatory Visit: Payer: 59 | Admitting: Family

## 2015-06-03 ENCOUNTER — Ambulatory Visit (INDEPENDENT_AMBULATORY_CARE_PROVIDER_SITE_OTHER): Payer: 59 | Admitting: Family

## 2015-06-03 ENCOUNTER — Encounter: Payer: Self-pay | Admitting: Family

## 2015-06-03 ENCOUNTER — Ambulatory Visit (HOSPITAL_COMMUNITY): Payer: 59 | Attending: Cardiovascular Disease

## 2015-06-03 VITALS — BP 122/80 | HR 67 | Temp 97.9°F | Resp 18 | Ht 65.5 in | Wt 172.8 lb

## 2015-06-03 DIAGNOSIS — M7989 Other specified soft tissue disorders: Secondary | ICD-10-CM

## 2015-06-03 DIAGNOSIS — M79605 Pain in left leg: Secondary | ICD-10-CM

## 2015-06-03 DIAGNOSIS — Z0000 Encounter for general adult medical examination without abnormal findings: Secondary | ICD-10-CM | POA: Diagnosis not present

## 2015-06-03 DIAGNOSIS — M79662 Pain in left lower leg: Secondary | ICD-10-CM | POA: Insufficient documentation

## 2015-06-03 NOTE — Assessment & Plan Note (Signed)
1) Anticipatory Guidance: Discussed importance of wearing a seatbelt while driving and not texting while driving; changing batteries in smoke detector at least once annually; wearing suntan lotion when outside; eating a balanced and moderate diet; getting physical activity at least 30 minutes per day.  2) Immunizations / Screenings / Labs:  All immunizations are up-to-date per recommendations. Due for a mammogram and Pap smear. Referral to GYN placed. Mammogram already scheduled. [facility]. Due for colonoscopy, referral for GI placed. All other screenings are up-to-date per recommendations. Obtain CBC, BMET, Lipid profile and TSH.    Overall well exam. Cardiovascular risk factors include tobacco use, hypertension, and being overweight. Discussed importance of cessation of tobacco use. Indicates she has previously tried Wellbutrin which was not good for her. States she is contemplating quitting smoking and that she knows she needs to. Follow up 1 ready to quit. Hypertension remains well controlled with current regimen of amlodipine. Continue current dosage of amlodipine. Discussed goal of losing 5-10% of current body weight to maintain optimal health through increasing nutrient density and decreasing saturated fats. Also increase physical activity to 30 minutes most days of the week. Follow up prevention exam in 1 year. Follow-up office visit pending lab work.

## 2015-06-03 NOTE — Progress Notes (Signed)
Subjective:    Patient ID: Cynthia Ware, female    DOB: 12/23/1966, 49 y.o.   MRN: [id number]  Chief Complaint  Patient presents with  . Establish Care    CPE    HPI:  Cynthia Ware is a 49 y.o. female who presents today for an annual wellness visit.   1) Health Maintenance -   Diet - Averages about 3 meals per day consisting of meats, fruits, vegetables; Caffeine occasionally  Exercise - Not currently; Working to improve this.    2) Preventative Exams / Immunizations:  Dental -- Due for exam   Vision -- Due for exam - will schedule     Health Maintenance  Topic Date Due  . HIV Screening  03/12/1979  . COLONOSCOPY  03/11/2014  . MAMMOGRAM  06/07/2015  . INFLUENZA VACCINE  07/29/2015  . PAP SMEAR  05/01/2016  . TETANUS/TDAP  05/04/2021  Mammogram will be completed 06/18/15   There is no immunization history on file for this patient.  Allergies  Allergen Reactions  . Lisinopril-Hydrochlorothiazide Swelling     Outpatient Prescriptions Prior to Visit  Medication Sig Dispense Refill  . amLODipine (NORVASC) 10 MG tablet Take 1 tablet (10 mg total) by mouth daily. 15 tablet 0  . buPROPion (WELLBUTRIN SR) 150 MG 12 hr tablet Take 150 mg by mouth 2 (two) times daily.    Marland Kitchen CALCIUM PO Take 600 mg by mouth daily.    . hydrochlorothiazide (HYDRODIURIL) 25 MG tablet Take 1 tablet (25 mg total) by mouth daily. 30 tablet 0  . loratadine (CLARITIN) 10 MG tablet Take 1 tablet (10 mg total) by mouth daily. 15 tablet 0  . Naproxen (NAPROSYN PO) Take by mouth as needed.    . peg 3350 powder (MOVIPREP) SOLR Moviprep as directed 1 kit 0  . predniSONE (DELTASONE) 20 MG tablet Take 2 tablets (40 mg total) by mouth daily. 10 tablet 0   No facility-administered medications prior to visit.     Past Medical History  Diagnosis Date  . Depression   . Hypertension   . Arthritis     Foot     Past Surgical History  Procedure Laterality Date  . Tubal ligation  2003      Family History  Problem Relation Age of Onset  . Colon cancer Neg Hx   . Esophageal cancer Neg Hx   . Rectal cancer Neg Hx   . Stomach cancer Neg Hx   . Hypertension Mother   . Cancer Father     prostate  . Hypertension Father   . Hypertension Sister      History   Social History  . Marital Status: Single    Spouse Name: N/A  . Number of Children: 1  . Years of Education: 16   Occupational History  . Housekeeping    Social History Main Topics  . Smoking status: Current Every Day Smoker -- 0.50 packs/day for 20 years    Types: Cigarettes  . Smokeless tobacco: Never Used  . Alcohol Use: 0.0 oz/week     Comment: socially  . Drug Use: No  . Sexual Activity: Not Currently    Birth Control/ Protection: Surgical   Other Topics Concern  . Not on file   Social History Narrative   Fun: Adventurous outdoor activities, TV (games shows, [place]), sports   Denies any religious beliefs effecting health care.       Review of Systems  Constitutional: Denies fever, chills, fatigue, or  significant weight gain/loss. HENT: Head: Denies headache or neck pain Ears: Denies changes in hearing, ringing in ears, earache, drainage Nose: Denies discharge, stuffiness, itching, nosebleed, sinus pain Throat: Denies sore throat, hoarseness, dry mouth, sores, thrush Eyes: Denies loss/changes in vision, pain, redness, blurry/double vision, flashing lights Cardiovascular: Denies chest pain/discomfort, tightness, palpitations, shortness of breath with activity, difficulty lying down, swelling, sudden awakening with shortness of breath Associated symptom of edema located in her left lower extremity has been going on for about 2-3 months. Indicates that there is occasional pain when she walks. Denies any trauma to the area. There are no modifying factors that make it better or worse.  Respiratory: Denies shortness of breath, cough, sputum production, wheezing Gastrointestinal: Denies  dysphasia, heartburn, change in appetite, nausea, change in bowel habits, rectal bleeding, constipation, diarrhea, yellow skin or eyes Genitourinary: Denies frequency, urgency, burning/pain, blood in urine, incontinence, change in urinary strength. Musculoskeletal: Denies muscle/joint pain, stiffness, back pain, redness or swelling of joints, trauma Skin: Denies rashes, lumps, itching, dryness, color changes, or hair/nail changes Neurological: Denies dizziness, fainting, seizures, weakness, numbness, tingling, tremor Psychiatric - Denies nervousness, stress, depression or memory loss Endocrine: Denies heat or cold intolerance, sweating, frequent urination, excessive thirst, changes in appetite Hematologic: Denies ease of bruising or bleeding     Objective:     BP 122/80 mmHg  Pulse 67  Temp(Src) 97.9 F (36.6 C) (Oral)  Resp 18  Ht 5' 5.5" (1.664 m)  Wt 172 lb 12.8 oz (78.382 kg)  BMI 28.31 kg/m2  SpO2 95%  LMP 03/09/2012 Nursing note and vital signs reviewed.  Physical Exam  Constitutional: She is oriented to person, place, and time. She appears well-developed and well-nourished.  HENT:  Head: Normocephalic.  Right Ear: Hearing, tympanic membrane, external ear and ear canal normal.  Left Ear: Hearing, tympanic membrane, external ear and ear canal normal.  Nose: Nose normal.  Mouth/Throat: Uvula is midline, oropharynx is clear and moist and mucous membranes are normal.  Eyes: Conjunctivae and EOM are normal. Pupils are equal, round, and reactive to light.  Neck: Neck supple. No JVD present. No tracheal deviation present. No thyromegaly present.  Cardiovascular: Normal rate, regular rhythm, normal heart sounds and intact distal pulses.   Left lower extremity: Mild inflammation or left lower leg noted compared to the right. Distal pulse is intact and appropriate. Mild tenderness of left middle/distal lower extremity. Indicates dorsiflexion on occasion results in discomfort.    Pulmonary/Chest: Effort normal and breath sounds normal.  Abdominal: Soft. Bowel sounds are normal. She exhibits no distension and no mass. There is no tenderness. There is no rebound and no guarding.  Musculoskeletal: Normal range of motion. She exhibits no edema or tenderness.  Lymphadenopathy:    She has no cervical adenopathy.  Neurological: She is alert and oriented to person, place, and time. She has normal reflexes. No cranial nerve deficit. She exhibits normal muscle tone. Coordination normal.  Skin: Skin is warm and dry.  Psychiatric: She has a normal mood and affect. Her behavior is normal. Judgment and thought content normal.       Assessment & Plan:    Problem List Items Addressed This Visit      Other   Routine general medical examination at a health care facility - Primary    1) Anticipatory Guidance: Discussed importance of wearing a seatbelt while driving and not texting while driving; changing batteries in smoke detector at least once annually; wearing suntan lotion when outside; eating a  balanced and moderate diet; getting physical activity at least 30 minutes per day.  2) Immunizations / Screenings / Labs:  All immunizations are up-to-date per recommendations. Due for a mammogram and Pap smear. Referral to GYN placed. Mammogram already scheduled. [facility]. Due for colonoscopy, referral for GI placed. All other screenings are up-to-date per recommendations. Obtain CBC, BMET, Lipid profile and TSH.    Overall well exam. Cardiovascular risk factors include tobacco use, hypertension, and being overweight. Discussed importance of cessation of tobacco use. Indicates she has previously tried Wellbutrin which was not good for her. States she is contemplating quitting smoking and that she knows she needs to. Follow up 1 ready to quit. Hypertension remains well controlled with current regimen of amlodipine. Continue current dosage of amlodipine. Discussed goal of losing 5-10% of  current body weight to maintain optimal health through increasing nutrient density and decreasing saturated fats. Also increase physical activity to 30 minutes most days of the week. Follow up prevention exam in 1 year. Follow-up office visit pending lab work.      Relevant Orders   Ambulatory referral to Gastroenterology   Ambulatory referral to Gynecology   Basic metabolic panel   CBC   Lipid panel   TSH   Pain and swelling of left lower leg    Presents with mild edema compared to the right. Cannot rule out blood clot. Obtain venous Doppler to rule out blood clot. Follow-up pending ultrasound results.      Relevant Orders   [place]

## 2015-06-03 NOTE — Patient Instructions (Addendum)
Thank you for choosing [facility].  Summary/Instructions:  Please stop by the lab on the basement level of the building for your blood work. Your results will be released to [facility] (or called to you) after review, usually within 72 hours after test completion. If any changes need to be made, you will be notified at that same time.  If your symptoms worsen or fail to improve, please contact our office for further instruction, or in case of emergency go directly to the emergency room at the closest medical facility.    Health Maintenance Adopting a healthy lifestyle and getting preventive care can go a long way to promote health and wellness. Talk with your health care provider about what schedule of regular examinations is right for you. This is a good chance for you to check in with your provider about disease prevention and staying healthy. In between checkups, there are plenty of things you can do on your own. Experts have done a lot of research about which lifestyle changes and preventive measures are most likely to keep you healthy. Ask your health care provider for more information. WEIGHT AND DIET  Eat a healthy diet  Be sure to include plenty of vegetables, fruits, low-fat dairy products, and lean protein.  Do not eat a lot of foods high in solid fats, added sugars, or salt.  Get regular exercise. This is one of the most important things you can do for your health.  Most adults should exercise for at least 150 minutes each week. The exercise should increase your heart rate and make you sweat (moderate-intensity exercise).  Most adults should also do strengthening exercises at least twice a week. This is in addition to the moderate-intensity exercise.  Maintain a healthy weight  Body mass index (BMI) is a measurement that can be used to identify possible weight problems. It estimates body fat based on height and weight. Your health care provider can help determine your BMI  and help you achieve or maintain a healthy weight.  For females 27 years of age and older:   A BMI below 18.5 is considered underweight.  A BMI of 18.5 to 24.9 is normal.  A BMI of 25 to 29.9 is considered overweight.  A BMI of 30 and above is considered obese.  Watch levels of cholesterol and blood lipids  You should start having your blood tested for lipids and cholesterol at 51 years of age, then have this test every 5 years.  You may need to have your cholesterol levels checked more often if:  Your lipid or cholesterol levels are high.  You are older than 51 years of age.  You are at high risk for heart disease.  CANCER SCREENING   Lung Cancer  Lung cancer screening is recommended for adults 64-54 years old who are at high risk for lung cancer because of a history of smoking.  A yearly low-dose CT scan of the lungs is recommended for people who:  Currently smoke.  Have quit within the past 15 years.  Have at least a 30-pack-year history of smoking. A pack year is smoking an average of one pack of cigarettes a day for 1 year.  Yearly screening should continue until it has been 15 years since you quit.  Yearly screening should stop if you develop a health problem that would prevent you from having lung cancer treatment.  Breast Cancer  Practice breast self-awareness. This means understanding how your breasts normally appear and feel.  It  also means doing regular breast self-exams. Let your health care provider know about any changes, no matter how small.  If you are in your 20s or 30s, you should have a clinical breast exam (CBE) by a health care provider every 1-3 years as part of a regular health exam.  If you are 40 or older, have a CBE every year. Also consider having a breast X-ray (mammogram) every year.  If you have a family history of breast cancer, talk to your health care provider about genetic screening.  If you are at high risk for breast cancer,  talk to your health care provider about having an MRI and a mammogram every year.  Breast cancer gene (BRCA) assessment is recommended for women who have family members with BRCA-related cancers. BRCA-related cancers include:  Breast.  Ovarian.  Tubal.  Peritoneal cancers.  Results of the assessment will determine the need for genetic counseling and BRCA1 and BRCA2 testing. Cervical Cancer Routine pelvic examinations to screen for cervical cancer are no longer recommended for nonpregnant women who are considered low risk for cancer of the pelvic organs (ovaries, uterus, and vagina) and who do not have symptoms. A pelvic examination may be necessary if you have symptoms including those associated with pelvic infections. Ask your health care provider if a screening pelvic exam is right for you.   The Pap test is the screening test for cervical cancer for women who are considered at risk.  If you had a hysterectomy for a problem that was not cancer or a condition that could lead to cancer, then you no longer need Pap tests.  If you are older than 65 years, and you have had normal Pap tests for the past 10 years, you no longer need to have Pap tests.  If you have had past treatment for cervical cancer or a condition that could lead to cancer, you need Pap tests and screening for cancer for at least 20 years after your treatment.  If you no longer get a Pap test, assess your risk factors if they change (such as having a new sexual partner). This can affect whether you should start being screened again.  Some women have medical problems that increase their chance of getting cervical cancer. If this is the case for you, your health care provider may recommend more frequent screening and Pap tests.  The human papillomavirus (HPV) test is another test that may be used for cervical cancer screening. The HPV test looks for the virus that can cause cell changes in the cervix. The cells collected  during the Pap test can be tested for HPV.  The HPV test can be used to screen women 30 years of age and older. Getting tested for HPV can extend the interval between normal Pap tests from three to five years.  An HPV test also should be used to screen women of any age who have unclear Pap test results.  After 51 years of age, women should have HPV testing as often as Pap tests.  Colorectal Cancer  This type of cancer can be detected and often prevented.  Routine colorectal cancer screening usually begins at 50 years of age and continues through 51 years of age.  Your health care provider may recommend screening at an earlier age if you have risk factors for colon cancer.  Your health care provider may also recommend using home test kits to check for hidden blood in the stool.  A small camera at the end   of a tube can be used to examine your colon directly (sigmoidoscopy or colonoscopy). This is done to check for the earliest forms of colorectal cancer.  Routine screening usually begins at age 50.  Direct examination of the colon should be repeated every 5-10 years through 51 years of age. However, you may need to be screened more often if early forms of precancerous polyps or small growths are found. Skin Cancer  Check your skin from head to toe regularly.  Tell your health care provider about any new moles or changes in moles, especially if there is a change in a mole's shape or color.  Also tell your health care provider if you have a mole that is larger than the size of a pencil eraser.  Always use sunscreen. Apply sunscreen liberally and repeatedly throughout the day.  Protect yourself by wearing long sleeves, pants, a wide-brimmed hat, and sunglasses whenever you are outside. HEART DISEASE, DIABETES, AND HIGH BLOOD PRESSURE   Have your blood pressure checked at least every 1-2 years. High blood pressure causes heart disease and increases the risk of stroke.  If you are  between 55 years and 79 years old, ask your health care provider if you should take aspirin to prevent strokes.  Have regular diabetes screenings. This involves taking a blood sample to check your fasting blood sugar level.  If you are at a normal weight and have a low risk for diabetes, have this test once every three years after 51 years of age.  If you are overweight and have a high risk for diabetes, consider being tested at a younger age or more often. PREVENTING INFECTION  Hepatitis B  If you have a higher risk for hepatitis B, you should be screened for this virus. You are considered at high risk for hepatitis B if:  You were born in a country where hepatitis B is common. Ask your health care provider which countries are considered high risk.  Your parents were born in a high-risk country, and you have not been immunized against hepatitis B (hepatitis B vaccine).  You have HIV or AIDS.  You use needles to inject street drugs.  You live with someone who has hepatitis B.  You have had sex with someone who has hepatitis B.  You get hemodialysis treatment.  You take certain medicines for conditions, including cancer, organ transplantation, and autoimmune conditions. Hepatitis C  Blood testing is recommended for:  Everyone born from 1945 through 1965.  Anyone with known risk factors for hepatitis C. Sexually transmitted infections (STIs)  You should be screened for sexually transmitted infections (STIs) including gonorrhea and chlamydia if:  You are sexually active and are younger than 51 years of age.  You are older than 51 years of age and your health care provider tells you that you are at risk for this type of infection.  Your sexual activity has changed since you were last screened and you are at an increased risk for chlamydia or gonorrhea. Ask your health care provider if you are at risk.  If you do not have HIV, but are at risk, it may be recommended that you  take a prescription medicine daily to prevent HIV infection. This is called pre-exposure prophylaxis (PrEP). You are considered at risk if:  You are sexually active and do not regularly use condoms or know the HIV status of your partner(s).  You take drugs by injection.  You are sexually active with a partner who has HIV.   Talk with your health care provider about whether you are at high risk of being infected with HIV. If you choose to begin PrEP, you should first be tested for HIV. You should then be tested every 3 months for as long as you are taking PrEP.  PREGNANCY   If you are premenopausal and you may become pregnant, ask your health care provider about preconception counseling.  If you may become pregnant, take 400 to 800 micrograms (mcg) of folic acid every day.  If you want to prevent pregnancy, talk to your health care provider about birth control (contraception). OSTEOPOROSIS AND MENOPAUSE   Osteoporosis is a disease in which the bones lose minerals and strength with aging. This can result in serious bone fractures. Your risk for osteoporosis can be identified using a bone density scan.  If you are 65 years of age or older, or if you are at risk for osteoporosis and fractures, ask your health care provider if you should be screened.  Ask your health care provider whether you should take a calcium or vitamin D supplement to lower your risk for osteoporosis.  Menopause may have certain physical symptoms and risks.  Hormone replacement therapy may reduce some of these symptoms and risks. Talk to your health care provider about whether hormone replacement therapy is right for you.  HOME CARE INSTRUCTIONS   Schedule regular health, dental, and eye exams.  Stay current with your immunizations.   Do not use any tobacco products including cigarettes, chewing tobacco, or electronic cigarettes.  If you are pregnant, do not drink alcohol.  If you are breastfeeding, limit how  much and how often you drink alcohol.  Limit alcohol intake to no more than 1 drink per day for nonpregnant women. One drink equals 12 ounces of beer, 5 ounces of wine, or 1 ounces of hard liquor.  Do not use street drugs.  Do not share needles.  Ask your health care provider for help if you need support or information about quitting drugs.  Tell your health care provider if you often feel depressed.  Tell your health care provider if you have ever been abused or do not feel safe at home. Document Released: 06/29/2011 Document Revised: 04/30/2014 Document Reviewed: 11/15/2013 [employer] Patient Information 2015 [employer]. This information is not intended to replace advice given to you by your health care provider. Make sure you discuss any questions you have with your health care provider.  

## 2015-06-03 NOTE — Progress Notes (Signed)
Pre visit review using our clinic review tool, if applicable. No additional management support is needed unless otherwise documented below in the visit note. 

## 2015-06-03 NOTE — Assessment & Plan Note (Signed)
Presents with mild edema compared to the right. Cannot rule out blood clot. Obtain venous Doppler to rule out blood clot. Follow-up pending ultrasound results.

## 2015-06-04 ENCOUNTER — Encounter: Payer: Self-pay | Admitting: Internal Medicine

## 2015-06-17 ENCOUNTER — Telehealth: Payer: Self-pay | Admitting: Family

## 2015-06-17 MED ORDER — NAPROXEN 500 MG PO TABS: 500.0000 mg | ORAL_TABLET | Freq: Two times a day (BID) | ORAL | Status: DC

## 2015-06-17 NOTE — Telephone Encounter (Signed)
Please inform patient that her her ultrasounds for her legs are negative. Therefore I would continue to elevate and compress as needed.

## 2015-06-17 NOTE — Telephone Encounter (Signed)
Medication sent.

## 2015-06-17 NOTE — Telephone Encounter (Signed)
Pt is requesting something for pain  

## 2015-06-17 NOTE — Telephone Encounter (Signed)
LVM letting pt know.  

## 2015-06-18 NOTE — Telephone Encounter (Signed)
Patient would like to know what kind of pain med was sent to pharmacy

## 2015-06-18 NOTE — Telephone Encounter (Signed)
Pt aware.

## 2015-06-20 LAB — HM MAMMOGRAPHY

## 2015-06-28 ENCOUNTER — Ambulatory Visit: Payer: Self-pay | Admitting: Women's Health

## 2015-07-05 ENCOUNTER — Encounter: Payer: Self-pay | Admitting: Family

## 2015-07-12 ENCOUNTER — Ambulatory Visit: Payer: 59 | Admitting: Family

## 2015-07-23 ENCOUNTER — Ambulatory Visit: Payer: Self-pay | Admitting: Women's Health

## 2015-07-24 ENCOUNTER — Encounter: Payer: Self-pay | Admitting: Gastroenterology

## 2015-07-24 ENCOUNTER — Other Ambulatory Visit (INDEPENDENT_AMBULATORY_CARE_PROVIDER_SITE_OTHER): Payer: 59

## 2015-07-24 DIAGNOSIS — Z0000 Encounter for general adult medical examination without abnormal findings: Secondary | ICD-10-CM

## 2015-07-24 LAB — BASIC METABOLIC PANEL
BUN: 14 mg/dL (ref 6–23)
CALCIUM: 9.2 mg/dL (ref 8.4–10.5)
CO2: 27 mEq/L (ref 19–32)
Chloride: 105 mEq/L (ref 96–112)
Creatinine, Ser: 0.97 mg/dL (ref 0.40–1.20)
GFR: 77.75 mL/min (ref >60.00)
Glucose, Bld: 92 mg/dL (ref 70–99)
Potassium: 3.8 mEq/L (ref 3.5–5.1)
Sodium: 139 mEq/L (ref 135–145)

## 2015-07-24 LAB — LIPID PANEL
CHOL/HDL RATIO: 3
Cholesterol: 153 mg/dL (ref 0–200)
HDL: 52.8 mg/dL (ref >39.00)
LDL Cholesterol: 85 mg/dL (ref 0–99)
NonHDL: 100.2
Triglycerides: 76 mg/dL (ref 0.0–149.0)
VLDL: 15.2 mg/dL (ref 0.0–40.0)

## 2015-07-24 LAB — TSH: TSH: 0.61 u[IU]/mL (ref 0.35–4.50)

## 2015-07-24 LAB — CBC
HEMATOCRIT: 35 % — AB (ref 36.0–46.0)
HEMOGLOBIN: 11.4 g/dL — AB (ref 12.0–15.0)
MCHC: 32.5 g/dL (ref 30.0–36.0)
MCV: 85.8 fl (ref 78.0–100.0)
Platelets: 300 10*3/uL (ref 150.0–400.0)
RBC: 4.08 Mil/uL (ref 3.87–5.11)
RDW: 15.2 % (ref 11.5–15.5)
WBC: 7.3 10*3/uL (ref 4.0–10.5)

## 2015-07-25 ENCOUNTER — Telehealth: Payer: Self-pay | Admitting: Family

## 2015-07-25 MED ORDER — AMLODIPINE BESYLATE 10 MG PO TABS: 10.0000 mg | ORAL_TABLET | Freq: Every day | ORAL | Status: DC

## 2015-07-25 NOTE — Telephone Encounter (Signed)
LVM for pt to call back.

## 2015-07-25 NOTE — Telephone Encounter (Signed)
Pt aware of results 

## 2015-07-25 NOTE — Telephone Encounter (Signed)
Please inform patient that her thyroid function, kidney function, cholesterol and electrolytes are all within the normal ranges. Her white/red blood cells show a slight decrease in her hemoglobin, the oxygen carrying part of the red blood cell, but there is nothing that needs to be done presently.

## 2015-08-07 ENCOUNTER — Encounter: Payer: 59 | Admitting: Internal Medicine

## 2015-08-23 ENCOUNTER — Ambulatory Visit (INDEPENDENT_AMBULATORY_CARE_PROVIDER_SITE_OTHER): Payer: 59 | Admitting: Women's Health

## 2015-08-23 ENCOUNTER — Other Ambulatory Visit (HOSPITAL_COMMUNITY)
Admission: RE | Admit: 2015-08-23 | Discharge: 2015-08-23 | Disposition: A | Discharge status: Home / Self Care | Payer: 59 | Source: Ambulatory Visit | Attending: Women's Health | Admitting: Women's Health

## 2015-08-23 ENCOUNTER — Encounter: Payer: Self-pay | Admitting: Women's Health

## 2015-08-23 VITALS — BP 124/80 | Ht 65.0 in | Wt 172.0 lb

## 2015-08-23 DIAGNOSIS — Z01419 Encounter for gynecological examination (general) (routine) without abnormal findings: Secondary | ICD-10-CM | POA: Diagnosis not present

## 2015-08-23 DIAGNOSIS — Z113 Encounter for screening for infections with a predominantly sexual mode of transmission: Secondary | ICD-10-CM

## 2015-08-23 DIAGNOSIS — Z559 Problems related to education and literacy, unspecified: Secondary | ICD-10-CM | POA: Diagnosis not present

## 2015-08-23 DIAGNOSIS — N760 Acute vaginitis: Secondary | ICD-10-CM | POA: Diagnosis not present

## 2015-08-23 DIAGNOSIS — Z1151 Encounter for screening for human papillomavirus (HPV): Secondary | ICD-10-CM | POA: Diagnosis not present

## 2015-08-23 DIAGNOSIS — F17200 Nicotine dependence, unspecified, uncomplicated: Secondary | ICD-10-CM

## 2015-08-23 DIAGNOSIS — B9689 Other specified bacterial agents as the cause of diseases classified elsewhere: Secondary | ICD-10-CM

## 2015-08-23 DIAGNOSIS — A499 Bacterial infection, unspecified: Secondary | ICD-10-CM

## 2015-08-23 LAB — WET PREP FOR TRICH, YEAST, CLUE
TRICH WET PREP: NONE SEEN
Yeast Wet Prep HPF POC: NONE SEEN

## 2015-08-23 MED ORDER — METRONIDAZOLE 500 MG PO TABS: 500.0000 mg | ORAL_TABLET | Freq: Two times a day (BID) | ORAL | Status: DC

## 2015-08-23 MED ORDER — VARENICLINE TARTRATE 0.5 MG PO TABS: 0.5000 mg | ORAL_TABLET | Freq: Two times a day (BID) | ORAL | Status: DC

## 2015-08-23 MED ORDER — VARENICLINE TARTRATE 1 MG PO TABS: 1.0000 mg | ORAL_TABLET | Freq: Two times a day (BID) | ORAL | Status: DC

## 2015-08-23 NOTE — Patient Instructions (Addendum)
Health Recommendations for Postmenopausal Women Respected and ongoing research has looked at the most common causes of death, disability, and poor quality of life in postmenopausal women. The causes include heart disease, diseases of blood vessels, diabetes, depression, cancer, and bone loss (osteoporosis). Many things can be done to help lower the chances of developing these and other common problems. CARDIOVASCULAR DISEASE Heart Disease: A heart attack is a medical emergency. Know the signs and symptoms of a heart attack. Below are things women can do to reduce their risk for heart disease.   Do not smoke. If you smoke, quit.  Aim for a healthy weight. Being overweight causes many preventable deaths. Eat a healthy and balanced diet and drink an adequate amount of liquids.  Get moving. Make a commitment to be more physically active. Aim for 30 minutes of activity on most, if not all days of the week.  Eat for heart health. Choose a diet that is low in saturated fat and cholesterol and eliminate trans fat. Include whole grains, vegetables, and fruits. Read and understand the labels on food containers before buying.  Know your numbers. Ask your caregiver to check your blood pressure, cholesterol (total, HDL, LDL, triglycerides) and blood glucose. Work with your caregiver on improving your entire clinical picture.  High blood pressure. Limit or stop your table salt intake (try salt substitute and food seasonings). Avoid salty foods and drinks. Read labels on food containers before buying. Eating well and exercising can help control high blood pressure. STROKE  Stroke is a medical emergency. Stroke may be the result of a blood clot in a blood vessel in the brain or by a brain hemorrhage (bleeding). Know the signs and symptoms of a stroke. To lower the risk of developing a stroke:  Avoid fatty foods.  Quit smoking.  Control your diabetes, blood pressure, and irregular heart rate. THROMBOPHLEBITIS  (BLOOD CLOT) OF THE LEG  Becoming overweight and leading a stationary lifestyle may also contribute to developing blood clots. Controlling your diet and exercising will help lower the risk of developing blood clots. CANCER SCREENING  Breast Cancer: Take steps to reduce your risk of breast cancer.  You should practice "breast self-awareness." This means understanding the normal appearance and feel of your breasts and should include breast self-examination. Any changes detected, no matter how small, should be reported to your caregiver.  After age 40, you should have a clinical breast exam (CBE) every year.  Starting at age 40, you should consider having a mammogram (breast X-ray) every year.  If you have a family history of breast cancer, talk to your caregiver about genetic screening.  If you are at high risk for breast cancer, talk to your caregiver about having an MRI and a mammogram every year.  Intestinal or Stomach Cancer: Tests to consider are a rectal exam, fecal occult blood, sigmoidoscopy, and colonoscopy. Women who are high risk may need to be screened at an earlier age and more often.  Cervical Cancer:  Beginning at age 30, you should have a Pap test every 3 years as long as the past 3 Pap tests have been normal.  If you have had past treatment for cervical cancer or a condition that could lead to cancer, you need Pap tests and screening for cancer for at least 20 years after your treatment.  If you had a hysterectomy for a problem that was not cancer or a condition that could lead to cancer, then you no longer need Pap tests.    If you are between ages 65 and 70, and you have had normal Pap tests going back 10 years, you no longer need Pap tests.  If Pap tests have been discontinued, risk factors (such as a new sexual partner) need to be reassessed to determine if screening should be resumed.  Some medical problems can increase the chance of getting cervical cancer. In these  cases, your caregiver may recommend more frequent screening and Pap tests.  Uterine Cancer: If you have vaginal bleeding after reaching menopause, you should notify your caregiver.  Ovarian Cancer: Other than yearly pelvic exams, there are no reliable tests available to screen for ovarian cancer at this time except for yearly pelvic exams.  Lung Cancer: Yearly chest X-rays can detect lung cancer and should be done on high risk women, such as cigarette smokers and women with chronic lung disease (emphysema).  Skin Cancer: A complete body skin exam should be done at your yearly examination. Avoid overexposure to the sun and ultraviolet light lamps. Use a strong sun block cream when in the sun. All of these things are important for lowering the risk of skin cancer. MENOPAUSE Menopause Symptoms: Hormone therapy products are effective for treating symptoms associated with menopause:  Moderate to severe hot flashes.  Night sweats.  Mood swings.  Headaches.  Tiredness.  Loss of sex drive.  Insomnia.  Other symptoms. Hormone replacement carries certain risks, especially in older women. Women who use or are thinking about using estrogen or estrogen with progestin treatments should discuss that with their caregiver. Your caregiver will help you understand the benefits and risks. The ideal dose of hormone replacement therapy is not known. The Food and Drug Administration (FDA) has concluded that hormone therapy should be used only at the lowest doses and for the shortest amount of time to reach treatment goals.  OSTEOPOROSIS Protecting Against Bone Loss and Preventing Fracture If you use hormone therapy for prevention of bone loss (osteoporosis), the risks for bone loss must outweigh the risk of the therapy. Ask your caregiver about other medications known to be safe and effective for preventing bone loss and fractures. To guard against bone loss or fractures, the following is recommended:  If  you are younger than age 50, take 1000 mg of calcium and at least 600 mg of Vitamin D per day.  If you are older than age 50 but younger than age 70, take 1200 mg of calcium and at least 600 mg of Vitamin D per day.  If you are older than age 70, take 1200 mg of calcium and at least 800 mg of Vitamin D per day. Smoking and excessive alcohol intake increases the risk of osteoporosis. Eat foods rich in calcium and vitamin D and do weight bearing exercises several times a week as your caregiver suggests. DIABETES Diabetes Mellitus: If you have type I or type 2 diabetes, you should keep your blood sugar under control with diet, exercise, and recommended medication. Avoid starchy and fatty foods, and too many sweets. Being overweight can make diabetes control more difficult. COGNITION AND MEMORY Cognition and Memory: Menopausal hormone therapy is not recommended for the prevention of cognitive disorders such as Alzheimer's disease or memory loss.  DEPRESSION  Depression may occur at any age, but it is common in elderly women. This may be because of physical, medical, social (loneliness), or financial problems and needs. If you are experiencing depression because of medical problems and control of symptoms, talk to your caregiver about this. Physical   activity and exercise may help with mood and sleep. Community and volunteer involvement may improve your sense of value and worth. If you have depression and you feel that the problem is getting worse or becoming severe, talk to your caregiver about which treatment options are best for you. ACCIDENTS  Accidents are common and can be serious in elderly woman. Prepare your house to prevent accidents. Eliminate throw rugs, place hand bars in bath, shower, and toilet areas. Avoid wearing high heeled shoes or walking on wet, snowy, and icy areas. Limit or stop driving if you have vision or hearing problems, or if you feel you are unsteady with your movements and  reflexes. HEPATITIS C Hepatitis C is a type of viral infection affecting the liver. It is spread mainly through contact with blood from an infected person. It can be treated, but if left untreated, it can lead to severe liver damage over the years. Many people who are infected do not know that the virus is in their blood. If you are a "baby-boomer", it is recommended that you have one screening test for Hepatitis C. IMMUNIZATIONS  Several immunizations are important to consider having during your senior years, including:   Tetanus, diphtheria, and pertussis booster shot.  Influenza every year before the flu season begins.  Pneumonia vaccine.  Shingles vaccine.  Others, as indicated based on your specific needs. Talk to your caregiver about these. Document Released: 02/05/2006 Document Revised: 04/30/2014 Document Reviewed: 10/01/2008 [facility] Patient Information 2015 [place], [place]. This information is not intended to replace advice given to you by your health care provider. Make sure you discuss any questions you have with your health care provider. Bacterial Vaginosis Bacterial vaginosis is an infection of the vagina. It happens when too many of certain germs (bacteria) grow in the vagina. HOME CARE  Take your medicine as told by your doctor.  Finish your medicine even if you start to feel better.  Do not have sex until you finish your medicine and are better.  Tell your sex partner that you have an infection. They should see their doctor for treatment.  Practice safe sex. Use condoms. Have only one sex partner. GET HELP IF:  You are not getting better after 3 days of treatment.  You have more grey fluid (discharge) coming from your vagina than before.  You have more pain than before.  You have a fever. MAKE SURE YOU:   Understand these instructions.  Will watch your condition.  Will get help right away if you are not doing well or get worse. Document Released:  09/22/2008 Document Revised: 10/04/2013 Document Reviewed: 07/26/2013 [facility] Patient Information 2015 [place], [place]. This information is not intended to replace advice given to you by your health care provider. Make sure you discuss any questions you have with your health care provider. Varenicline oral tablets What is this medicine? VARENICLINE (var EN i kleen) is used to help people quit smoking. It can reduce the symptoms caused by stopping smoking. It is used with a patient support program recommended by your physician. This medicine may be used for other purposes; ask your health care provider or pharmacist if you have questions. COMMON BRAND NAME(S): Chantix What should I tell my health care provider before I take this medicine? They need to know if you have any of these conditions: -bipolar disorder, depression, schizophrenia or other mental illness -heart disease -if you often drink alcohol -kidney disease -peripheral vascular disease -seizures -stroke -suicidal thoughts, plans, or attempt; a  previous suicide attempt by you or a family member -an unusual or allergic reaction to varenicline, other medicines, foods, dyes, or preservatives -pregnant or trying to get pregnant -breast-feeding How should I use this medicine? You should set a date to stop smoking and tell your doctor. Start this medicine one week before the quit date. You can also start taking this medicine before you choose a quit date, and then pick a quit date that is between 8 and 35 days of treatment with this medicine. Stick to your plan; ask about support groups or other ways to help you remain a 'quitter'. Take this medicine by mouth after eating. Take with a full glass of water. Follow the directions on the prescription label. Take your doses at regular intervals. Do not take your medicine more often than directed. A special MedGuide will be given to you by the pharmacist with each prescription and refill. Be  sure to read this information carefully each time. Talk to your pediatrician regarding the use of this medicine in children. This medicine is not approved for use in children. Overdosage: If you think you have taken too much of this medicine contact a poison control center or emergency room at once. NOTE: This medicine is only for you. Do not share this medicine with others. What if I miss a dose? If you miss a dose, take it as soon as you can. If it is almost time for your next dose, take only that dose. Do not take double or extra doses. What may interact with this medicine? -alcohol or any product that contains alcohol -insulin -other stop smoking aids -theophylline -warfarin This list may not describe all possible interactions. Give your health care provider a list of all the medicines, herbs, non-prescription drugs, or dietary supplements you use. Also tell them if you smoke, drink alcohol, or use illegal drugs. Some items may interact with your medicine. What should I watch for while using this medicine? Visit your doctor or health care professional for regular check ups. Ask for ongoing advice and encouragement from your doctor or healthcare professional, friends, and family to help you quit. If you smoke while on this medication, quit again Your mouth may get dry. Chewing sugarless gum or sucking hard candy, and drinking plenty of water may help. Contact your doctor if the problem does not go away or is severe. You may get drowsy or dizzy. Do not drive, use machinery, or do anything that needs mental alertness until you know how this medicine affects you. Do not stand or sit up quickly, especially if you are an older patient. This reduces the risk of dizzy or fainting spells. The use of this medicine may increase the chance of suicidal thoughts or actions. Pay special attention to how you are responding while on this medicine. Any worsening of mood, or thoughts of suicide or dying should be  reported to your health care professional right away. What side effects may I notice from receiving this medicine? Side effects that you should report to your doctor or health care professional as soon as possible: -allergic reactions like skin rash, itching or hives, swelling of the face, lips, tongue, or throat -breathing problems -changes in vision -chest pain or chest tightness -confusion, trouble speaking or understanding -fast, irregular heartbeat -feeling faint or lightheaded, falls -fever -pain in legs when walking -problems with balance, talking, walking -redness, blistering, peeling or loosening of the skin, including inside the mouth -ringing in ears -seizures -sudden numbness or weakness  of the face, arm or leg -suicidal thoughts or other mood changes -trouble passing urine or change in the amount of urine -unusual bleeding or bruising -unusually weak or tired Side effects that usually do not require medical attention (report to your doctor or health care professional if they continue or are bothersome): -constipation -headache -nausea, vomiting -strange dreams -stomach gas -trouble sleeping This list may not describe all possible side effects. Call your doctor for medical advice about side effects. You may report side effects to FDA at 1-800-FDA-1088. Where should I keep my medicine? Keep out of the reach of children. Store at room temperature between 15 and 30 degrees C (59 and 86 degrees F). Throw away any unused medicine after the expiration date. NOTE: This sheet is a summary. It may not cover all possible information. If you have questions about this medicine, talk to your doctor, pharmacist, or health care provider.  2015, [employer]vier/[employer] Standard. (2013-09-25 13:37:47)

## 2015-08-23 NOTE — Addendum Note (Signed)
Addended by: Burnett Kanaris on: 08/23/2015 04:14 PM   Modules accepted: Orders

## 2015-08-23 NOTE — Progress Notes (Signed)
Cynthia Ware [birth year] [id number]    History:    Presents for annual exam.  Postmenopausal 2 years with no bleeding no HRT. Reports history of normal Paps and mammograms. Scheduled for colonoscopy 08/2015. Smokes approximately half a pack daily. Primary care manages hypertension. Partner erectile dysfunction.  Past medical history, past surgical history, family history and social history were all reviewed and documented in the EPIC chart. Works in [employer]. Parents and sister hypertension. 32 year old daughter doing well in college.  ROS:  A ROS was performed and pertinent positives and negatives are included.  Exam:  Filed Vitals:   08/23/15 1501  BP: 124/80    General appearance:  Normal Thyroid:  Symmetrical, normal in size, without palpable masses or nodularity. Respiratory  Auscultation:  Clear without wheezing or rhonchi Cardiovascular  Auscultation:  Regular rate, without rubs, murmurs or gallops  Edema/varicosities:  Not grossly evident Abdominal  Soft,nontender, without masses, guarding or rebound.  Liver/spleen:  No organomegaly noted  Hernia:  None appreciated  Skin  Inspection:  Grossly normal   Breasts: Examined lying and sitting. Pendulous     Right: Without masses, retractions, discharge or axillary adenopathy.     Left: Without masses, retractions, discharge or axillary adenopathy. Gentitourinary   Inguinal/mons:  Normal without inguinal adenopathy  External genitalia:  Normal  BUS/Urethra/Skene's glands:  Normal  Vagina:  Moderate amount of a white discharge with odor, wet prep positive for amines, clues, TNTC bacteria  Cervix:  Normal  Uterus:  normal in size, shape and contour.  Midline and mobile  Adnexa/parametria:     Rt: Without masses or tenderness.   Lt: Without masses or tenderness.  Anus and perineum: Normal  Digital rectal exam: Normal sphincter tone without palpated masses or tenderness  Assessment/Plan:  49 y.o. SBF G1 P1 for annual  exam.    Postmenopausal/no bleeding/no HRT Hypertension-primary care meds and labs STD screen Bacteria vaginosis Smoking cessation  Plan: Flagyl 500 twice daily for 7 days #14 prescription, proper use given and reviewed. Call if no relief of discharge. Options for smoking cessation reviewed, will try Chantix reviewed risks of strange dreams, anxiety, prescription, proper use given and reviewed start up instructions. Reviewed importance of no alcohol, smoking. SBE's, continue annual screening mammogram, calcium rich diet, vitamin D 1000 daily encouraged. GC/Chlamydia, HIV, hep B, C, RPR. Pap with HR HPV typing.    Esto, 3:49 PM 08/23/2015

## 2015-08-24 LAB — GC/CHLAMYDIA PROBE AMP
CT PROBE, AMP APTIMA: NEGATIVE
GC PROBE AMP APTIMA: NEGATIVE

## 2015-08-24 LAB — RPR

## 2015-08-24 LAB — HIV ANTIBODY (ROUTINE TESTING W REFLEX): HIV 1&2 Ab, 4th Generation: NONREACTIVE

## 2015-08-24 LAB — HEPATITIS B SURFACE ANTIGEN: HEP B S AG: NEGATIVE

## 2015-08-24 LAB — HEPATITIS C ANTIBODY: HCV Ab: NEGATIVE

## 2015-08-27 ENCOUNTER — Telehealth: Payer: Self-pay | Admitting: *Deleted

## 2015-08-27 LAB — CYTOLOGY - PAP

## 2015-08-27 NOTE — Telephone Encounter (Signed)
Prior Authorization for Chanxtix 1 mg cont month box done online ,will wait for response.

## 2015-08-28 ENCOUNTER — Other Ambulatory Visit: Payer: Self-pay | Admitting: Women's Health

## 2015-08-28 MED ORDER — BUPROPION HCL 100 MG PO TABS
100.0000 mg | ORAL_TABLET | Freq: Two times a day (BID) | ORAL | Status: DC
Start: 2015-08-28 — End: 2016-05-15

## 2015-08-28 NOTE — Telephone Encounter (Signed)
Pt aware Rx sent.  

## 2015-08-28 NOTE — Telephone Encounter (Signed)
Cynthia Ware pt Rx for Chanxtix 1 mg was denied by insurance and maybe approved if patient has tried and fail generic Wellbutrin. Please advise

## 2015-08-28 NOTE — Telephone Encounter (Signed)
Okay, Wellbutrin  100 mg twice daily generic have her take second dose prior to 1 PM can cause issues with sleep. #60 with 3 refills.

## 2015-09-09 ENCOUNTER — Ambulatory Visit (AMBULATORY_SURGERY_CENTER): Payer: Self-pay | Admitting: *Deleted

## 2015-09-09 VITALS — Ht 65.0 in | Wt 170.0 lb

## 2015-09-09 DIAGNOSIS — Z1211 Encounter for screening for malignant neoplasm of colon: Secondary | ICD-10-CM

## 2015-09-09 MED ORDER — NA SULFATE-K SULFATE-MG SULF 17.5-3.13-1.6 GM/177ML PO SOLN: ORAL | Status: DC

## 2015-09-09 NOTE — Progress Notes (Signed)
Patient denies any allergies to eggs or soy. Patient denies any problems with anesthesia/sedation. Patient denies any oxygen use at home and does not take any diet/weight loss medications. EMMI education assisgned to patient on colonoscopy, this was explained and instructions given to patient. Patient request new procedure date,she wants a Monday.   Appointment changed to   10-28-15 at 10 am, arrival time 9 am. Pt is aware of new arrival time and date.

## 2015-09-17 ENCOUNTER — Encounter: Payer: 59 | Admitting: Gastroenterology

## 2015-10-09 ENCOUNTER — Telehealth: Payer: Self-pay | Admitting: Gastroenterology

## 2015-10-09 NOTE — Telephone Encounter (Signed)
Called patient. No answer, left message.

## 2015-10-09 NOTE — Telephone Encounter (Signed)
Spoke with patient. She states the Suprep is $50 and she can't afford that. She did state she can buy the Miralax prep OTC. Patient had already rescheduled her colonoscopy for 12-02-15(pv over 60 days) so I made her a new pre-visit appointment so we can go over the Miralax prep before exam.

## 2015-10-28 ENCOUNTER — Encounter: Payer: 59 | Admitting: Gastroenterology

## 2015-11-04 ENCOUNTER — Other Ambulatory Visit: Payer: Self-pay | Admitting: *Deleted

## 2015-11-04 MED ORDER — AMLODIPINE BESYLATE 10 MG PO TABS: 10.0000 mg | ORAL_TABLET | Freq: Every day | ORAL | Status: DC

## 2015-11-04 NOTE — Telephone Encounter (Signed)
Left msg on triage needing a 90 day sent on her BP med. Sent electronicaly...'Cynthia Ware

## 2015-11-12 ENCOUNTER — Ambulatory Visit (AMBULATORY_SURGERY_CENTER): Payer: Self-pay

## 2015-11-12 VITALS — Ht 65.5 in | Wt 174.0 lb

## 2015-11-12 DIAGNOSIS — Z1211 Encounter for screening for malignant neoplasm of colon: Secondary | ICD-10-CM

## 2015-11-12 NOTE — Progress Notes (Signed)
No allergies to eggs or soy No home oxygen No diet/weight loss meds No past problems with anesthesia  No email and internet; refused emmi

## 2015-11-25 ENCOUNTER — Telehealth: Payer: Self-pay | Admitting: Gastroenterology

## 2015-11-25 NOTE — Telephone Encounter (Signed)
No answer.  No id on answering machine.  [place].  Angela/PV

## 2015-11-25 NOTE — Telephone Encounter (Signed)
[place].  No ID on answering machine.  Shameeka Silliman/PV

## 2015-11-26 ENCOUNTER — Encounter: Payer: Self-pay | Admitting: Gastroenterology

## 2015-11-26 NOTE — Telephone Encounter (Signed)
Spoke with pt and answered all her questions regarding certain foods she needed to stop for 5 days and what liquids she could drink.All questions were answered.She will call back if she has any further problems.Cynthia Ware

## 2015-12-02 ENCOUNTER — Ambulatory Visit (AMBULATORY_SURGERY_CENTER): Payer: 59 | Admitting: Gastroenterology

## 2015-12-02 ENCOUNTER — Encounter: Payer: Self-pay | Admitting: Gastroenterology

## 2015-12-02 VITALS — BP 130/77 | HR 58 | Temp 97.5°F | Resp 22 | Ht 65.5 in | Wt 174.0 lb

## 2015-12-02 DIAGNOSIS — K635 Polyp of colon: Secondary | ICD-10-CM

## 2015-12-02 DIAGNOSIS — Z1211 Encounter for screening for malignant neoplasm of colon: Secondary | ICD-10-CM

## 2015-12-02 DIAGNOSIS — D125 Benign neoplasm of sigmoid colon: Secondary | ICD-10-CM

## 2015-12-02 MED ORDER — SODIUM CHLORIDE 0.9 % IV SOLN: 500.0000 mL | INTRAVENOUS | Status: DC

## 2015-12-02 NOTE — Progress Notes (Signed)
Report to PACU, RN, vss, BBS= Clear.  

## 2015-12-02 NOTE — Progress Notes (Signed)
Called to room to assist during endoscopic procedure.  Patient ID and intended procedure confirmed with present staff. Received instructions for my participation in the procedure from the performing physician.  

## 2015-12-02 NOTE — Patient Instructions (Signed)
YOU HAD AN ENDOSCOPIC PROCEDURE TODAY AT THE [facility]:   Refer to the procedure report that was given to you for any specific questions about what was found during the examination.  If the procedure report does not answer your questions, please call your gastroenterologist to clarify.  If you requested that your care partner not be given the details of your procedure findings, then the procedure report has been included in a sealed envelope for you to review at your convenience later.  YOU SHOULD EXPECT: Some feelings of bloating in the abdomen. Passage of more gas than usual.  Walking can help get rid of the air that was put into your GI tract during the procedure and reduce the bloating. If you had a lower endoscopy (such as a colonoscopy or flexible sigmoidoscopy) you may notice spotting of blood in your stool or on the toilet paper. If you underwent a bowel prep for your procedure, you may not have a normal bowel movement for a few days.  Please Note:  You might notice some irritation and congestion in your nose or some drainage.  This is from the oxygen used during your procedure.  There is no need for concern and it should clear up in a day or so.  SYMPTOMS TO REPORT IMMEDIATELY:   Following lower endoscopy (colonoscopy or flexible sigmoidoscopy):  Excessive amounts of blood in the stool  Significant tenderness or worsening of abdominal pains  Swelling of the abdomen that is new, acute  Fever of 100F or higher   For urgent or emergent issues, a gastroenterologist can be reached at any hour by calling [phone].   DIET: Your first meal following the procedure should be a small meal and then it is ok to progress to your normal diet. Heavy or fried foods are harder to digest and may make you feel nauseous or bloated.  Likewise, meals heavy in dairy and vegetables can increase bloating.  Drink plenty of fluids but you should avoid alcoholic beverages for 24  hours.  ACTIVITY:  You should plan to take it easy for the rest of today and you should NOT DRIVE or use heavy machinery until tomorrow (because of the sedation medicines used during the test).    FOLLOW UP: Our staff will call the number listed on your records the next business day following your procedure to check on you and address any questions or concerns that you may have regarding the information given to you following your procedure. If we do not reach you, we will leave a message.  However, if you are feeling well and you are not experiencing any problems, there is no need to return our call.  We will assume that you have returned to your regular daily activities without incident.  If any biopsies were taken you will be contacted by phone or by letter within the next 1-3 weeks.  Please call us at [phone] if you have not heard about the biopsies in 3 weeks.    SIGNATURES/CONFIDENTIALITY: You and/or your care partner have signed paperwork which will be entered into your electronic medical record.  These signatures attest to the fact that that the information above on your After Visit Summary has been reviewed and is understood.  Full responsibility of the confidentiality of this discharge information lies with you and/or your care-partner. 

## 2015-12-02 NOTE — Op Note (Signed)
[place]  [address]. [place], [address]   COLONOSCOPY PROCEDURE REPORT  PATIENT: Cynthia, Ware  MR#: [id number] BIRTHDATE: August 08, 1966 , 49  yrs. old GENDER: female ENDOSCOPIST: Harl Bowie, MD REFERRED BY: PROCEDURE DATE:  12/02/2015 PROCEDURE:   Colonoscopy, screening and Colonoscopy with cold biopsy polypectomy First Screening Colonoscopy - Avg.  risk and is 50 yrs.  old or older Yes.  Prior Negative Screening - Now for repeat screening. N/A  History of Adenoma - Now for follow-up colonoscopy & has been > or = to 3 yrs.  N/A  Polyps removed today? Yes ASA CLASS:   Class I INDICATIONS:Screening for colonic neoplasia and Colorectal Neoplasm Risk Assessment for this procedure is average risk. MEDICATIONS: Propofol 200 mg IV  DESCRIPTION OF PROCEDURE:   After the risks benefits and alternatives of the procedure were thoroughly explained, informed consent was obtained.  The digital rectal exam revealed no abnormalities of the rectum.   The LB [id number]  endoscope was introduced through the anus and advanced to the terminal ileum which was intubated for a short distance. No adverse events experienced.   The quality of the prep was good.  The instrument was then slowly withdrawn as the colon was fully examined. Estimated blood loss is zero unless otherwise noted in this procedure report.   COLON FINDINGS: The examined terminal ileum appeared to be normal. A sessile polyp ranging between 3-31mm in size was found in the sigmoid colon.  A polypectomy was performed with cold forceps.  The resection was complete, the polyp tissue was completely retrieved and sent to histology.   The examination was otherwise normal. Retroflexed views revealed internal hemorrhoids. The time to cecum = 4.0 Withdrawal time = 8.8   The scope was withdrawn and the procedure completed. COMPLICATIONS: There were no immediate complications.  ENDOSCOPIC IMPRESSION: 1.    The examined terminal ileum appeared to be normal 2.   Sessile polyp ranging between 3-76mm in size was found in the sigmoid colon; polypectomy was performed with cold forceps 3.   The examination was otherwise normal  RECOMMENDATIONS: If the polyp(s) removed today are proven to be adenomatous (pre-cancerous) polyps, you will need a repeat colonoscopy in 5 years.  Otherwise you should continue to follow colorectal cancer screening guidelines for "routine risk" patients with colonoscopy in 10 years.  You will receive a letter within 1-2 weeks with the results of your biopsy as well as final recommendations.  Please call my office if you have not received a letter after 3 weeks.  eSigned:  Harl Bowie, MD 12/02/2015 3:28 PM

## 2015-12-03 ENCOUNTER — Telehealth: Payer: Self-pay | Admitting: *Deleted

## 2015-12-03 NOTE — Telephone Encounter (Signed)
  Follow up Call-  Call back number 12/02/2015  Post procedure Call Back phone  # [phone]  Permission to leave phone message Yes     Patient questions:  Do you have a fever, pain , or abdominal swelling? No. Pain Score  0 *  Have you tolerated food without any problems? Yes.    Have you been able to return to your normal activities? Yes.    Do you have any questions about your discharge instructions: Diet   No. Medications  No. Follow up visit  No.  Do you have questions or concerns about your Care? No.  Actions: * If pain score is 4 or above: No action needed, pain <4.

## 2015-12-16 ENCOUNTER — Encounter: Payer: Self-pay | Admitting: Gastroenterology

## 2016-03-06 ENCOUNTER — Ambulatory Visit: Payer: BLUE CROSS/BLUE SHIELD | Admitting: Family

## 2016-05-15 ENCOUNTER — Ambulatory Visit (INDEPENDENT_AMBULATORY_CARE_PROVIDER_SITE_OTHER): Payer: BLUE CROSS/BLUE SHIELD | Admitting: Internal Medicine

## 2016-05-15 ENCOUNTER — Encounter: Payer: Self-pay | Admitting: Internal Medicine

## 2016-05-15 VITALS — BP 140/100 | HR 76 | Temp 97.9°F | Ht 65.5 in | Wt 182.4 lb

## 2016-05-15 DIAGNOSIS — L0203 Carbuncle of face: Secondary | ICD-10-CM | POA: Diagnosis not present

## 2016-05-15 DIAGNOSIS — L0202 Furuncle of face: Secondary | ICD-10-CM | POA: Insufficient documentation

## 2016-05-15 MED ORDER — SULFAMETHOXAZOLE-TRIMETHOPRIM 800-160 MG PO TABS
1.0000 | ORAL_TABLET | Freq: Two times a day (BID) | ORAL | Status: DC
Start: 2016-05-15 — End: 2016-08-06

## 2016-05-15 NOTE — Progress Notes (Signed)
Pre visit review using our clinic review tool, if applicable. No additional management support is needed unless otherwise documented below in the visit note. 

## 2016-05-15 NOTE — Patient Instructions (Signed)
You have an infected boil or abscess.  Start taking the antibiotic as directed - take the whole prescription.  Use warm compresses and try to drain the boil.    Call with questions.   Abscess An abscess is an infected area that contains a collection of pus and debris.It can occur in almost any part of the body. An abscess is also known as a furuncle or boil. CAUSES  An abscess occurs when tissue gets infected. This can occur from blockage of oil or sweat glands, infection of hair follicles, or a minor injury to the skin. As the body tries to fight the infection, pus collects in the area and creates pressure under the skin. This pressure causes pain. People with weakened immune systems have difficulty fighting infections and get certain abscesses more often.  SYMPTOMS Usually an abscess develops on the skin and becomes a painful mass that is red, warm, and tender. If the abscess forms under the skin, you may feel a moveable soft area under the skin. Some abscesses break open (rupture) on their own, but most will continue to get worse without care. The infection can spread deeper into the body and eventually into the bloodstream, causing you to feel ill.  DIAGNOSIS  Your caregiver will take your medical history and perform a physical exam. A sample of fluid may also be taken from the abscess to determine what is causing your infection. TREATMENT  Your caregiver may prescribe antibiotic medicines to fight the infection. However, taking antibiotics alone usually does not cure an abscess. Your caregiver may need to make a small cut (incision) in the abscess to drain the pus. In some cases, gauze is packed into the abscess to reduce pain and to continue draining the area. HOME CARE INSTRUCTIONS   Only take over-the-counter or prescription medicines for pain, discomfort, or fever as directed by your caregiver.  If you were prescribed antibiotics, take them as directed. Finish them even if you start to  feel better.  If gauze is used, follow your caregiver's directions for changing the gauze.  To avoid spreading the infection:  Keep your draining abscess covered with a bandage.  Wash your hands well.  Do not share personal care items, towels, or whirlpools with others.  Avoid skin contact with others.  Keep your skin and clothes clean around the abscess.  Keep all follow-up appointments as directed by your caregiver. SEEK MEDICAL CARE IF:   You have increased pain, swelling, redness, fluid drainage, or bleeding.  You have muscle aches, chills, or a general ill feeling.  You have a fever. MAKE SURE YOU:   Understand these instructions.  Will watch your condition.  Will get help right away if you are not doing well or get worse.   This information is not intended to replace advice given to you by your health care provider. Make sure you discuss any questions you have with your health care provider.   Document Released: 09/23/2005 Document Revised: 06/14/2012 Document Reviewed: 02/26/2012 Elsevier Interactive Patient Education [employer].

## 2016-05-15 NOTE — Progress Notes (Signed)
Subjective:    Patient ID: Cynthia Ware, female    DOB: 1966/05/13, 50 y.o.   MRN: [id number]  HPI She is here for an acute visit.   Boil on face:  She noticed it one month ago.  She put alcohol on it and it did come to a head and she was able to express pus out.  It has gotten smaller,but has not resolved.  She denies fevers.  She denies prior episodes on her face, but does get boils on her buttocks.  She thinks she has one in her genital region.  The one on her face is painful, but not as bad as it was when it was larger.    Medications and allergies reviewed with patient and updated if appropriate.  Patient Active Problem List   Diagnosis Date Noted  . Routine general medical examination at a health care facility 06/03/2015  . Pain and swelling of left lower leg 06/03/2015  . Fibroids 04/27/2012  . Abnormal uterine bleeding 04/27/2012    Current Outpatient Prescriptions on File Prior to Visit  Medication Sig Dispense Refill  . amLODipine (NORVASC) 10 MG tablet Take 1 tablet (10 mg total) by mouth daily. 90 tablet 2  . Omega 3-6-9 Fatty Acids (OMEGA DHA PO) Take 1 tablet by mouth 4 (four) times daily.    . [DISCONTINUED] lisinopril-hydrochlorothiazide (PRINZIDE,ZESTORETIC) 20-12.5 MG per tablet Take 1 tablet by mouth daily.     No current facility-administered medications on file prior to visit.    Past Medical History  Diagnosis Date  . Depression   . Hypertension   . Arthritis     Foot    Past Surgical History  Procedure Laterality Date  . Tubal ligation  2003    Social History   Social History  . Marital Status: Single    Spouse Name: N/A  . Number of Children: 1  . Years of Education: 16   Occupational History  . Housekeeping    Social History Main Topics  . Smoking status: Current Every Day Smoker -- 0.50 packs/day for 20 years    Types: Cigarettes  . Smokeless tobacco: Never Used  . Alcohol Use: 0.0 oz/week    0 Standard drinks or equivalent per  week     Comment: socially  . Drug Use: No  . Sexual Activity: Yes    Birth Control/ Protection: Surgical     Comment: INTERCOURSE AGE 38, SEXUAL PARTNERS MORE THAN 5   Other Topics Concern  . None   Social History Narrative   Fun: Adventurous outdoor activities, TV (games shows, [place]), sports   Denies any religious beliefs effecting health care.     Family History  Problem Relation Age of Onset  . Colon cancer Neg Hx   . Esophageal cancer Neg Hx   . Rectal cancer Neg Hx   . Stomach cancer Neg Hx   . Hypertension Mother   . Cancer Father     prostate  . Hypertension Father   . Hypertension Sister     Review of Systems  Constitutional: Negative for fever and chills.  Skin: Positive for color change (mild redness over boil on face).       Pain and swelling at site of boil       Objective:   Filed Vitals:   05/15/16 1316  BP: 140/100  Pulse: 76  Temp: 97.9 F (36.6 C)   Filed Weights   05/15/16 1316  Weight: 182 lb 7 oz (  82.753 kg)   Body mass index is 29.89 kg/(m^2).   Physical Exam  Constitutional: She appears well-developed and well-nourished. No distress.  Skin: Skin is warm and dry. She is not diaphoretic.  Olive sized boil left lower face with mild erythema overlying it, mild tenderness, indurated without fluctuance, no open wound or discharge with pressure          Assessment & Plan:   See Problem List for Assessment and Plan of chronic medical problems.

## 2016-05-15 NOTE — Assessment & Plan Note (Signed)
Infected, improved size and pain after she was able to express pus, but did not resolve Still infection and possible genital boil Start bactrim ds BID x 10 days Warm compresses Try to express pus Call or return if no improvement or concerns

## 2016-08-06 ENCOUNTER — Ambulatory Visit (INDEPENDENT_AMBULATORY_CARE_PROVIDER_SITE_OTHER): Payer: BLUE CROSS/BLUE SHIELD | Admitting: Family

## 2016-08-06 ENCOUNTER — Encounter: Payer: Self-pay | Admitting: Family

## 2016-08-06 DIAGNOSIS — L0203 Carbuncle of face: Secondary | ICD-10-CM | POA: Diagnosis not present

## 2016-08-06 DIAGNOSIS — L0202 Furuncle of face: Secondary | ICD-10-CM

## 2016-08-06 MED ORDER — AMLODIPINE BESYLATE 10 MG PO TABS: 10.0000 mg | ORAL_TABLET | Freq: Every day | ORAL | 2 refills | Status: DC

## 2016-08-06 NOTE — Progress Notes (Signed)
Subjective:    Patient ID: Cynthia Ware, female    DOB: 1966-11-10, 50 y.o.   MRN: [id number]  Chief Complaint  Patient presents with  . Cyst    x2 month has had a cyst on the left side of her face, was given antibiotics but it did not help at all    HPI:  Cynthia Ware is a 50 y.o. female who  has a past medical history of Arthritis; Depression; and Hypertension. and presents today For a follow-up office visit.  Previously seen in the office approximately 3 months ago for a boil located on her face noted to be at the time improving in size and pain with little resolution. Prescribed Bactrim and warm compresses. Continues to experience the same boil located on the left side of her face which has not improved with the course of antibiotics. Described as painful on occasion with some itching. Modifying factors include alcohol which did not help very much. Increases and decreases in size. No fevers.   Allergies  Allergen Reactions  . Lisinopril-Hydrochlorothiazide Swelling     Current Outpatient Prescriptions on File Prior to Visit  Medication Sig Dispense Refill  . Multiple Vitamin (MULTIVITAMIN) tablet Take 1 tablet by mouth daily.    Ernestine Conrad 3-6-9 Fatty Acids (OMEGA DHA PO) Take 1 tablet by mouth 4 (four) times daily.    . [DISCONTINUED] lisinopril-hydrochlorothiazide (PRINZIDE,ZESTORETIC) 20-12.5 MG per tablet Take 1 tablet by mouth daily.     No current facility-administered medications on file prior to visit.      Review of Systems  Constitutional: Negative for chills and fever.  Skin:       Positive for a boil.       Objective:    BP 130/86 (BP Location: Left Arm, Patient Position: Sitting, Cuff Size: Normal)   Pulse 73   Temp 97.8 F (36.6 C) (Oral)   Resp 16   Ht 5' 5.5" (1.664 m)   Wt 185 lb (83.9 kg)   LMP 03/09/2012   SpO2 98%   BMI 30.32 kg/m  Nursing note and vital signs reviewed.  Physical Exam  Constitutional: She is oriented to person, place,  and time. She appears well-developed and well-nourished. No distress.  HENT:  1 cm diameter raised lesion located on the left side of her face lateral to her mouth with no significant tenderness and is firm to the touch. No discharge or significant redness.   Cardiovascular: Normal rate, regular rhythm, normal heart sounds and intact distal pulses.   Pulmonary/Chest: Effort normal and breath sounds normal.  Neurological: She is alert and oriented to person, place, and time.  Skin: Skin is warm and dry.  Psychiatric: She has a normal mood and affect. Her behavior is normal. Judgment and thought content normal.       Assessment & Plan:   Problem List Items Addressed This Visit      Musculoskeletal and Integument   Boil, face    Symptoms and exam consistent with a small cyst/boil that has been refractory to conservative treatment and antibiotics. May require drainage. Refer to dermatology for follow up given location.       Relevant Orders   Ambulatory referral to Dermatology    Other Visit Diagnoses   None.      I have discontinued Ms. Traber's sulfamethoxazole-trimethoprim. I am also having her maintain her Omega 3-6-9 Fatty Acids (OMEGA DHA PO), multivitamin, and amLODipine.   Meds ordered this encounter  Medications  .  amLODipine (NORVASC) 10 MG tablet    Sig: Take 1 tablet (10 mg total) by mouth daily.    Dispense:  90 tablet    Refill:  2     Follow-up: Return if symptoms worsen or fail to improve.  Mauricio Po, FNP

## 2016-08-06 NOTE — Assessment & Plan Note (Signed)
Symptoms and exam consistent with a small cyst/boil that has been refractory to conservative treatment and antibiotics. May require drainage. Refer to dermatology for follow up given location.

## 2016-08-06 NOTE — Patient Instructions (Signed)
Thank you for choosing [facility].  Summary/Instructions:  They will call to schedule your appointment with dermatology.  If your symptoms worsen or fail to improve, please contact our office for further instruction, or in case of emergency go directly to the emergency room at the closest medical facility.     Abscess An abscess is an infected area that contains a collection of pus and debris.It can occur in almost any part of the body. An abscess is also known as a furuncle or boil. CAUSES  An abscess occurs when tissue gets infected. This can occur from blockage of oil or sweat glands, infection of hair follicles, or a minor injury to the skin. As the body tries to fight the infection, pus collects in the area and creates pressure under the skin. This pressure causes pain. People with weakened immune systems have difficulty fighting infections and get certain abscesses more often.  SYMPTOMS Usually an abscess develops on the skin and becomes a painful mass that is red, warm, and tender. If the abscess forms under the skin, you may feel a moveable soft area under the skin. Some abscesses break open (rupture) on their own, but most will continue to get worse without care. The infection can spread deeper into the body and eventually into the bloodstream, causing you to feel ill.  DIAGNOSIS  Your caregiver will take your medical history and perform a physical exam. A sample of fluid may also be taken from the abscess to determine what is causing your infection. TREATMENT  Your caregiver may prescribe antibiotic medicines to fight the infection. However, taking antibiotics alone usually does not cure an abscess. Your caregiver may need to make a small cut (incision) in the abscess to drain the pus. In some cases, gauze is packed into the abscess to reduce pain and to continue draining the area. HOME CARE INSTRUCTIONS   Only take over-the-counter or prescription medicines for pain,  discomfort, or fever as directed by your caregiver.  If you were prescribed antibiotics, take them as directed. Finish them even if you start to feel better.  If gauze is used, follow your caregiver's directions for changing the gauze.  To avoid spreading the infection:  Keep your draining abscess covered with a bandage.  Wash your hands well.  Do not share personal care items, towels, or whirlpools with others.  Avoid skin contact with others.  Keep your skin and clothes clean around the abscess.  Keep all follow-up appointments as directed by your caregiver. SEEK MEDICAL CARE IF:   You have increased pain, swelling, redness, fluid drainage, or bleeding.  You have muscle aches, chills, or a general ill feeling.  You have a fever. MAKE SURE YOU:   Understand these instructions.  Will watch your condition.  Will get help right away if you are not doing well or get worse.   This information is not intended to replace advice given to you by your health care provider. Make sure you discuss any questions you have with your health care provider.   Document Released: 09/23/2005 Document Revised: 06/14/2012 Document Reviewed: 02/26/2012 Elsevier Interactive Patient Education [employer].

## 2016-08-14 ENCOUNTER — Encounter: Payer: Self-pay | Admitting: Family

## 2016-08-14 ENCOUNTER — Ambulatory Visit (INDEPENDENT_AMBULATORY_CARE_PROVIDER_SITE_OTHER): Payer: BLUE CROSS/BLUE SHIELD | Admitting: Family

## 2016-08-14 DIAGNOSIS — M79605 Pain in left leg: Secondary | ICD-10-CM | POA: Diagnosis not present

## 2016-08-14 DIAGNOSIS — M7989 Other specified soft tissue disorders: Secondary | ICD-10-CM | POA: Diagnosis not present

## 2016-08-14 DIAGNOSIS — M79662 Pain in left lower leg: Secondary | ICD-10-CM

## 2016-08-14 MED ORDER — MELOXICAM 15 MG PO TABS: 15.0000 mg | ORAL_TABLET | Freq: Every day | ORAL | 0 refills | Status: DC

## 2016-08-14 NOTE — Progress Notes (Signed)
Subjective:    Patient ID: Cynthia Ware, female    DOB: 1966/05/20, 50 y.o.   MRN: [id number]  Chief Complaint  Patient presents with  . Foot Pain    both feet having been bothering her, have been in pain, left ankle is swollen, when working the swelling gets worse, states that it aches pretty bad, x1 year    HPI:  Cynthia Ware is a 50 y.o. female who  has a past medical history of Arthritis; Depression; and Hypertension. and presents today for an office visit.   This is a new problem. Associated symptom of pain located in her bilateral feet with swelling located in her left ankle has been going on for about 1 year. Pain is described as achy. Modifying factors include elevation. She does have previous history of ankle injuries from playing basketball when she was younger. No numbness or tingling.   Allergies  Allergen Reactions  . Lisinopril-Hydrochlorothiazide Swelling     Current Outpatient Prescriptions on File Prior to Visit  Medication Sig Dispense Refill  . amLODipine (NORVASC) 10 MG tablet Take 1 tablet (10 mg total) by mouth daily. 90 tablet 2  . Multiple Vitamin (MULTIVITAMIN) tablet Take 1 tablet by mouth daily.    Cynthia Ware 3-6-9 Fatty Acids (OMEGA DHA Ware) Take 1 tablet by mouth 4 (four) times daily.    . [DISCONTINUED] lisinopril-hydrochlorothiazide (PRINZIDE,ZESTORETIC) 20-12.5 MG per tablet Take 1 tablet by mouth daily.     No current facility-administered medications on file prior to visit.      Past Surgical History:  Procedure Laterality Date  . TUBAL LIGATION  2003    Past Medical History:  Diagnosis Date  . Arthritis    Foot  . Depression   . Hypertension      Review of Systems  Constitutional: Negative for chills and fever.  Musculoskeletal:       Positive for left foot and ankle pain.   Neurological: Negative for weakness and numbness.      Objective:    BP (!) 138/92 (BP Location: Left Arm, Patient Position: Sitting, Cuff Size:  Normal)   Pulse 73   Temp 97.8 F (36.6 C) (Oral)   Resp 16   Ht 5' 5.5" (1.664 m)   Wt 185 lb (83.9 kg)   LMP 03/09/2012   SpO2 99%   BMI 30.32 kg/m  Nursing note and vital signs reviewed.  Physical Exam  Constitutional: She is oriented to person, place, and time. She appears well-developed and well-nourished. No distress.  Cardiovascular: Normal rate, regular rhythm, normal heart sounds and intact distal pulses.   Pulmonary/Chest: Effort normal and breath sounds normal.  Musculoskeletal:  Left ankle - moderate edema with no obvious deformity or discoloration. Tenderness elicited over medial aspect of ankle joint as well as calcaneofibular ligament. Range of motion within normal limits. Strength is normal. Capillary refill pulses are intact and appropriate. Negative anterior drawer. Negative talar tilt. Negative Kleigers.   Neurological: She is alert and oriented to person, place, and time.  Skin: Skin is warm and dry.  Psychiatric: She has a normal mood and affect. Her behavior is normal. Judgment and thought content normal.   Examination: Ultrasound of the Ankle Date:  08/14/2016 Patient Name: Cynthia Ware History:  Findings:  There is mild/moderate ankle joint effusion.  Anteriorly, the tibialis anterior, extensor hallucis longus, extensor digitorum longus are normal. Medially, the posterior tibialis, flexor digitorum longus, flexor hallucis longus, tibial nerve, and deltoid ligament are  normal. Laterally, the peroneus brevis and longus are normal, as are the anterior talofibular, calcaneofibular, and anterior tibiofibular ligaments. Posteriorly, the Achilles tendon and plantar fascia are normal. Focused ultrasound examination directed by patient symptoms revealed no abnormality.  Impression: Mild to moderate joint effusion otherwise normal exam.          All images are located under the media tab. [place] ordered, performed and interpreted by Cynthia Piedra, FNP     Assessment &  Plan:   Problem List Items Addressed This Visit      Other   Pain and swelling of left lower leg    Normal ultrasound with no significant findings. There is continued edema. Recommend conservative treatment with ice, compression, home exercise therapy and Aircast. Start meloxicam. Follow up in 3 weeks or sooner if needed. Consider additional imaging or cortisone injection as needed.       Relevant Medications   meloxicam (MOBIC) 15 MG tablet   Other Relevant Orders   [place] Extrem Low Left Ltd    Other Visit Diagnoses   None.      I am having Ms. Smoker start on meloxicam. I am also having her maintain her Omega 3-6-9 Fatty Acids (OMEGA DHA Ware), multivitamin, and amLODipine.   Meds ordered this encounter  Medications  . meloxicam (MOBIC) 15 MG tablet    Sig: Take 1 tablet (15 mg total) by mouth daily.    Dispense:  30 tablet    Refill:  0    Order Specific Question:   Supervising Provider    Answer:   Cynthia Ware A [id number]     Follow-up: Return in about 3 weeks (around 09/04/2016), or if symptoms worsen or fail to improve.  Cynthia Po, FNP

## 2016-08-14 NOTE — Patient Instructions (Signed)
Thank you for choosing [facility].  Summary/Instructions:  Ice x 20 minutes every 2 hours as needed and after activity as standing.  Elevate when seated.  Compression wrap.  Ankle exercises daily.  Meloxicam daily.  Your prescription(s) have been submitted to your pharmacy or been printed and provided for you. Please take as directed and contact our office if you believe you are having problem(s) with the medication(s) or have any questions.  If your symptoms worsen or fail to improve, please contact our office for further instruction, or in case of emergency go directly to the emergency room at the closest medical facility.    Acute Ankle Sprain With Phase I Rehab An acute ankle sprain is a partial or complete tear in one or more of the ligaments of the ankle due to traumatic injury. The severity of the injury depends on both the number of ligaments sprained and the grade of sprain. There are 3 grades of sprains.   A grade 1 sprain is a mild sprain. There is a slight pull without obvious tearing. There is no loss of strength, and the muscle and ligament are the correct length.  A grade 2 sprain is a moderate sprain. There is tearing of fibers within the substance of the ligament where it connects two bones or two cartilages. The length of the ligament is increased, and there is usually decreased strength.  A grade 3 sprain is a complete rupture of the ligament and is uncommon. In addition to the grade of sprain, there are three types of ankle sprains.  Lateral ankle sprains: This is a sprain of one or more of the three ligaments on the outer side (lateral) of the ankle. These are the most common sprains. Medial ankle sprains: There is one large triangular ligament of the inner side (medial) of the ankle that is susceptible to injury. Medial ankle sprains are less common. Syndesmosis, "high ankle," sprains: The syndesmosis is the ligament that connects the two bones of the lower  leg. Syndesmosis sprains usually only occur with very severe ankle sprains. SYMPTOMS  Pain, tenderness, and swelling in the ankle, starting at the side of injury that may progress to the whole ankle and foot with time.  "Pop" or tearing sensation at the time of injury.  Bruising that may spread to the heel.  Impaired ability to walk soon after injury. CAUSES   Acute ankle sprains are caused by trauma placed on the ankle that temporarily forces or pries the anklebone (talus) out of its normal socket.  Stretching or tearing of the ligaments that normally hold the joint in place (usually due to a twisting injury). RISK INCREASES WITH:  Previous ankle sprain.  Sports in which the foot may land awkwardly (i.e., basketball, volleyball, or soccer) or walking or running on uneven or rough surfaces.  Shoes with inadequate support to prevent sideways motion when stress occurs.  Poor strength and flexibility.  Poor balance skills.  Contact sports. PREVENTION   Warm up and stretch properly before activity.  Maintain physical fitness:  Ankle and leg flexibility, muscle strength, and endurance.  Cardiovascular fitness.  Balance training activities.  Use proper technique and have a coach correct improper technique.  Taping, protective strapping, bracing, or high-top tennis shoes may help prevent injury. Initially, tape is best; however, it loses most of its support function within 10 to 15 minutes.  Wear proper-fitted protective shoes (High-top shoes with taping or bracing is more effective than either alone).  Provide the ankle  with support during sports and practice activities for 12 months following injury. PROGNOSIS   If treated properly, ankle sprains can be expected to recover completely; however, the length of recovery depends on the degree of injury.  A grade 1 sprain usually heals enough in 5 to 7 days to allow modified activity and requires an average of 6 weeks to heal  completely.  A grade 2 sprain requires 6 to 10 weeks to heal completely.  A grade 3 sprain requires 12 to 16 weeks to heal.  A syndesmosis sprain often takes more than 3 months to heal. RELATED COMPLICATIONS   Frequent recurrence of symptoms may result in a chronic problem. Appropriately addressing the problem the first time decreases the frequency of recurrence and optimizes healing time. Severity of the initial sprain does not predict the likelihood of later instability.  Injury to other structures (bone, cartilage, or tendon).  A chronically unstable or arthritic ankle joint is a possibility with repeated sprains. TREATMENT Treatment initially involves the use of ice, medication, and compression bandages to help reduce pain and inflammation. Ankle sprains are usually immobilized in a walking cast or boot to allow for healing. Crutches may be recommended to reduce pressure on the injury. After immobilization, strengthening and stretching exercises may be necessary to regain strength and a full range of motion. Surgery is rarely needed to treat ankle sprains. MEDICATION   Nonsteroidal anti-inflammatory medications, such as aspirin and ibuprofen (do not take for the first 3 days after injury or within 7 days before surgery), or other minor pain relievers, such as acetaminophen, are often recommended. Take these as directed by your caregiver. Contact your caregiver immediately if any bleeding, stomach upset, or signs of an allergic reaction occur from these medications.  Ointments applied to the skin may be helpful.  Pain relievers may be prescribed as necessary by your caregiver. Do not take prescription pain medication for longer than 4 to 7 days. Use only as directed and only as much as you need. HEAT AND COLD  Cold treatment (icing) is used to relieve pain and reduce inflammation for acute and chronic cases. Cold should be applied for 10 to 15 minutes every 2 to 3 hours for inflammation  and pain and immediately after any activity that aggravates your symptoms. Use ice packs or an ice massage.  Heat treatment may be used before performing stretching and strengthening activities prescribed by your caregiver. Use a heat pack or a warm soak. SEEK IMMEDIATE MEDICAL CARE IF:   Pain, swelling, or bruising worsens despite treatment.  You experience pain, numbness, discoloration, or coldness in the foot or toes.  New, unexplained symptoms develop (drugs used in treatment may produce side effects.) EXERCISES  PHASE I EXERCISES RANGE OF MOTION (ROM) AND STRETCHING EXERCISES - Ankle Sprain, Acute Phase I, Weeks 1 to 2 These exercises may help you when beginning to restore flexibility in your ankle. You will likely work on these exercises for the 1 to 2 weeks after your injury. Once your physician, physical therapist, or athletic trainer sees adequate progress, he or she will advance your exercises. While completing these exercises, remember:   Restoring tissue flexibility helps normal motion to return to the joints. This allows healthier, less painful movement and activity.  An effective stretch should be held for at least 30 seconds.  A stretch should never be painful. You should only feel a gentle lengthening or release in the stretched tissue. RANGE OF MOTION - Dorsi/Plantar Flexion  While sitting  with your right / left knee straight, draw the top of your foot upwards by flexing your ankle. Then reverse the motion, pointing your toes downward.  Hold each position for __________ seconds.  After completing your first set of exercises, repeat this exercise with your knee bent. Repeat __________ times. Complete this exercise __________ times per day.  RANGE OF MOTION - Ankle Alphabet  Imagine your right / left big toe is a pen.  Keeping your hip and knee still, write out the entire alphabet with your "pen." Make the letters as large as you can without increasing any  discomfort. Repeat __________ times. Complete this exercise __________ times per day.  STRENGTHENING EXERCISES - Ankle Sprain, Acute -Phase I, Weeks 1 to 2 These exercises may help you when beginning to restore strength in your ankle. You will likely work on these exercises for 1 to 2 weeks after your injury. Once your physician, physical therapist, or athletic trainer sees adequate progress, he or she will advance your exercises. While completing these exercises, remember:   Muscles can gain both the endurance and the strength needed for everyday activities through controlled exercises.  Complete these exercises as instructed by your physician, physical therapist, or athletic trainer. Progress the resistance and repetitions only as guided.  You may experience muscle soreness or fatigue, but the pain or discomfort you are trying to eliminate should never worsen during these exercises. If this pain does worsen, stop and make certain you are following the directions exactly. If the pain is still present after adjustments, discontinue the exercise until you can discuss the trouble with your clinician. STRENGTH - Dorsiflexors  Secure a rubber exercise band/tubing to a fixed object (i.e., table, pole) and loop the other end around your right / left foot.  Sit on the floor facing the fixed object. The band/tubing should be slightly tense when your foot is relaxed.  Slowly draw your foot back toward you using your ankle and toes.  Hold this position for __________ seconds. Slowly release the tension in the band and return your foot to the starting position. Repeat __________ times. Complete this exercise __________ times per day.  STRENGTH - Plantar-flexors   Sit with your right / left leg extended. Holding onto both ends of a rubber exercise band/tubing, loop it around the ball of your foot. Keep a slight tension in the band.  Slowly push your toes away from you, pointing them downward.  Hold  this position for __________ seconds. Return slowly, controlling the tension in the band/tubing. Repeat __________ times. Complete this exercise __________ times per day.  STRENGTH - Ankle Eversion  Secure one end of a rubber exercise band/tubing to a fixed object (table, pole). Loop the other end around your foot just before your toes.  Place your fists between your knees. This will focus your strengthening at your ankle.  Drawing the band/tubing across your opposite foot, slowly, pull your little toe out and up. Make sure the band/tubing is positioned to resist the entire motion.  Hold this position for __________ seconds. Have your muscles resist the band/tubing as it slowly pulls your foot back to the starting position.  Repeat __________ times. Complete this exercise __________ times per day.  STRENGTH - Ankle Inversion  Secure one end of a rubber exercise band/tubing to a fixed object (table, pole). Loop the other end around your foot just before your toes.  Place your fists between your knees. This will focus your strengthening at your ankle.  Slowly,  pull your big toe up and in, making sure the band/tubing is positioned to resist the entire motion.  Hold this position for __________ seconds.  Have your muscles resist the band/tubing as it slowly pulls your foot back to the starting position. Repeat __________ times. Complete this exercises __________ times per day.  STRENGTH - Towel Curls  Sit in a chair positioned on a non-carpeted surface.  Place your right / left foot on a towel, keeping your heel on the floor.  Pull the towel toward your heel by only curling your toes. Keep your heel on the floor.  If instructed by your physician, physical therapist, or athletic trainer, add weight to the end of the towel. Repeat __________ times. Complete this exercise __________ times per day.   This information is not intended to replace advice given to you by your health care  provider. Make sure you discuss any questions you have with your health care provider.   Document Released: 07/15/2005 Document Revised: 01/04/2015 Document Reviewed: 03/28/2009 Elsevier Interactive Patient Education [employer].

## 2016-08-14 NOTE — Assessment & Plan Note (Addendum)
Normal ultrasound with no significant findings. There is continued edema. Recommend conservative treatment with ice, compression, home exercise therapy and Aircast. Start meloxicam. Follow up in 3 weeks or sooner if needed. Consider additional imaging or cortisone injection as needed.

## 2016-08-25 ENCOUNTER — Encounter: Payer: Self-pay | Admitting: Women's Health

## 2016-09-11 ENCOUNTER — Encounter: Payer: Self-pay | Admitting: Women's Health

## 2016-09-14 ENCOUNTER — Encounter: Payer: BLUE CROSS/BLUE SHIELD | Admitting: Family

## 2016-09-28 ENCOUNTER — Ambulatory Visit (INDEPENDENT_AMBULATORY_CARE_PROVIDER_SITE_OTHER)
Admission: RE | Admit: 2016-09-28 | Discharge: 2016-09-28 | Disposition: A | Discharge status: Home / Self Care | Payer: BLUE CROSS/BLUE SHIELD | Source: Ambulatory Visit | Attending: Nurse Practitioner | Admitting: Nurse Practitioner

## 2016-09-28 ENCOUNTER — Ambulatory Visit (INDEPENDENT_AMBULATORY_CARE_PROVIDER_SITE_OTHER): Payer: BLUE CROSS/BLUE SHIELD | Admitting: Nurse Practitioner

## 2016-09-28 ENCOUNTER — Encounter: Payer: Self-pay | Admitting: Nurse Practitioner

## 2016-09-28 VITALS — BP 126/82 | HR 86 | Temp 100.7°F | Ht 65.0 in | Wt 182.0 lb

## 2016-09-28 DIAGNOSIS — J9801 Acute bronchospasm: Secondary | ICD-10-CM

## 2016-09-28 DIAGNOSIS — R6889 Other general symptoms and signs: Secondary | ICD-10-CM

## 2016-09-28 DIAGNOSIS — R0989 Other specified symptoms and signs involving the circulatory and respiratory systems: Secondary | ICD-10-CM

## 2016-09-28 DIAGNOSIS — J0140 Acute pansinusitis, unspecified: Secondary | ICD-10-CM

## 2016-09-28 DIAGNOSIS — J189 Pneumonia, unspecified organism: Secondary | ICD-10-CM | POA: Diagnosis not present

## 2016-09-28 LAB — POCT INFLUENZA A/B

## 2016-09-28 MED ORDER — ALBUTEROL SULFATE HFA 108 (90 BASE) MCG/ACT IN AERS: 2.0000 | INHALATION_SPRAY | Freq: Four times a day (QID) | RESPIRATORY_TRACT | 0 refills | Status: DC | PRN

## 2016-09-28 MED ORDER — FLUTICASONE PROPIONATE 50 MCG/ACT NA SUSP: 2.0000 | Freq: Every day | NASAL | 0 refills | Status: DC

## 2016-09-28 MED ORDER — OXYMETAZOLINE HCL 0.05 % NA SOLN: 1.0000 | Freq: Two times a day (BID) | NASAL | 0 refills | Status: DC

## 2016-09-28 MED ORDER — DM-GUAIFENESIN ER 30-600 MG PO TB12: 1.0000 | ORAL_TABLET | Freq: Two times a day (BID) | ORAL | 0 refills | Status: DC | PRN

## 2016-09-28 MED ORDER — HYDROCODONE-HOMATROPINE 5-1.5 MG/5ML PO SYRP: 5.0000 mL | ORAL_SOLUTION | Freq: Every evening | ORAL | 0 refills | Status: DC | PRN

## 2016-09-28 MED ORDER — LEVOFLOXACIN 500 MG PO TABS: 500.0000 mg | ORAL_TABLET | Freq: Every day | ORAL | 0 refills | Status: DC

## 2016-09-28 NOTE — Progress Notes (Signed)
Subjective:  Patient ID: Cynthia Ware, female    DOB: 05-25-1966  Age: 50 y.o. MRN: [id number]  CC: Cough (Pt stated Cough/Chest congestion, body aching, fever 1 week)   Cough  This is a new problem. The current episode started in the past 7 days. The problem has been gradually worsening. The problem occurs constantly. The cough is productive of sputum. Associated symptoms include chest pain, chills, ear pain, a fever, headaches, myalgias, nasal congestion, postnasal drip, rhinorrhea, a sore throat, shortness of breath and wheezing. Pertinent negatives include no rash. The symptoms are aggravated by lying down. Risk factors for lung disease include smoking/tobacco exposure. She has tried OTC cough suppressant for the symptoms. The treatment provided no relief. There is no history of environmental allergies or pneumonia.    Outpatient Medications Prior to Visit  Medication Sig Dispense Refill  . amLODipine (NORVASC) 10 MG tablet Take 1 tablet (10 mg total) by mouth daily. 90 tablet 2  . meloxicam (MOBIC) 15 MG tablet Take 1 tablet (15 mg total) by mouth daily. 30 tablet 0  . Multiple Vitamin (MULTIVITAMIN) tablet Take 1 tablet by mouth daily.    Ernestine Conrad 3-6-9 Fatty Acids (OMEGA DHA PO) Take 1 tablet by mouth 4 (four) times daily.     No facility-administered medications prior to visit.     ROS See HPI  Objective:  BP 126/82 (BP Location: Left Arm, Patient Position: Sitting, Cuff Size: Normal)   Pulse 86   Temp (!) 100.7 F (38.2 C)   Ht 5\' 5"  (1.651 m)   Wt 182 lb (82.6 kg)   LMP 03/09/2012   SpO2 98%   BMI 30.29 kg/m   BP Readings from Last 3 Encounters:  09/28/16 126/82  08/14/16 (!) 138/92  08/06/16 130/86    Wt Readings from Last 3 Encounters:  09/28/16 182 lb (82.6 kg)  08/14/16 185 lb (83.9 kg)  08/06/16 185 lb (83.9 kg)    Physical Exam  Constitutional: She is oriented to person, place, and time. No distress.  HENT:  Right Ear: External ear normal.  Tympanic membrane is injected and erythematous. A middle ear effusion is present.  Left Ear: External ear normal. Tympanic membrane is injected and erythematous. A middle ear effusion is present.  Mouth/Throat: Uvula is midline. Posterior oropharyngeal erythema present. No oropharyngeal exudate.  Eyes: No scleral icterus.  Neck: Normal range of motion. Neck supple.  Cardiovascular: Normal rate and normal heart sounds.   Pulmonary/Chest: Effort normal. No stridor. No respiratory distress. She has wheezes. She has rales. She exhibits tenderness.  Lymphadenopathy:    She has cervical adenopathy.  Neurological: She is alert and oriented to person, place, and time.  Skin: Skin is warm and dry.  Psychiatric: She has a normal mood and affect. Her behavior is normal.  Vitals reviewed.   Lab Results  Component Value Date   WBC 7.3 07/24/2015   HGB 11.4 (L) 07/24/2015   HCT 35.0 (L) 07/24/2015   PLT 300.0 07/24/2015   GLUCOSE 92 07/24/2015   CHOL 153 07/24/2015   TRIG 76.0 07/24/2015   HDL 52.80 07/24/2015   LDLCALC 85 07/24/2015   NA 139 07/24/2015   K 3.8 07/24/2015   CL 105 07/24/2015   CREATININE 0.97 07/24/2015   BUN 14 07/24/2015   CO2 27 07/24/2015   TSH 0.61 07/24/2015    No results found.  Assessment & Plan:   Marycarol was seen today for cough.  Diagnoses and all orders for this  visit:  Community acquired pneumonia of right lung, unspecified part of lung -     DG Chest 2 View; Future -     albuterol (PROVENTIL HFA;VENTOLIN HFA) 108 (90 Base) MCG/ACT inhaler; Inhale 2 puffs into the lungs every 6 (six) hours as needed for wheezing or shortness of breath. -     HYDROcodone-homatropine (HYCODAN) 5-1.5 MG/5ML syrup; Take 5 mLs by mouth at bedtime as needed for cough. -     dextromethorphan-guaiFENesin (MUCINEX DM) 30-600 MG 12hr tablet; Take 1 tablet by mouth 2 (two) times daily as needed for cough. -     Discontinue: levofloxacin (LEVAQUIN) 500 MG tablet; Take 1 tablet (500  mg total) by mouth daily. -     levofloxacin (LEVAQUIN) 500 MG tablet; Take 1 tablet (500 mg total) by mouth daily.  Flu-like symptoms -     POCT Influenza A/B  Cough due to bronchospasm -     DG Chest 2 View; Future -     albuterol (PROVENTIL HFA;VENTOLIN HFA) 108 (90 Base) MCG/ACT inhaler; Inhale 2 puffs into the lungs every 6 (six) hours as needed for wheezing or shortness of breath. -     HYDROcodone-homatropine (HYCODAN) 5-1.5 MG/5ML syrup; Take 5 mLs by mouth at bedtime as needed for cough. -     dextromethorphan-guaiFENesin (MUCINEX DM) 30-600 MG 12hr tablet; Take 1 tablet by mouth 2 (two) times daily as needed for cough.  Acute non-recurrent pansinusitis -     fluticasone (FLONASE) 50 MCG/ACT nasal spray; Place 2 sprays into both nostrils daily. -     oxymetazoline (AFRIN NASAL SPRAY) 0.05 % nasal spray; Place 1 spray into both nostrils 2 (two) times daily. Use only for 3days, then stop -     Discontinue: levofloxacin (LEVAQUIN) 500 MG tablet; Take 1 tablet (500 mg total) by mouth daily. -     levofloxacin (LEVAQUIN) 500 MG tablet; Take 1 tablet (500 mg total) by mouth daily.   I am having Ms. Poppen start on albuterol, HYDROcodone-homatropine, fluticasone, oxymetazoline, and dextromethorphan-guaiFENesin. I am also having her maintain her Omega 3-6-9 Fatty Acids (OMEGA DHA PO), multivitamin, amLODipine, meloxicam, and levofloxacin.  Meds ordered this encounter  Medications  . albuterol (PROVENTIL HFA;VENTOLIN HFA) 108 (90 Base) MCG/ACT inhaler    Sig: Inhale 2 puffs into the lungs every 6 (six) hours as needed for wheezing or shortness of breath.    Dispense:  1 Inhaler    Refill:  0    Order Specific Question:   Supervising Provider    Answer:   Cassandria Anger [[id number]]  . HYDROcodone-homatropine (HYCODAN) 5-1.5 MG/5ML syrup    Sig: Take 5 mLs by mouth at bedtime as needed for cough.    Dispense:  120 mL    Refill:  0    Order Specific Question:   Supervising Provider     Answer:   Cassandria Anger [[id number]]  . fluticasone (FLONASE) 50 MCG/ACT nasal spray    Sig: Place 2 sprays into both nostrils daily.    Dispense:  16 g    Refill:  0    Order Specific Question:   Supervising Provider    Answer:   Cassandria Anger [[id number]]  . oxymetazoline (AFRIN NASAL SPRAY) 0.05 % nasal spray    Sig: Place 1 spray into both nostrils 2 (two) times daily. Use only for 3days, then stop    Dispense:  30 mL    Refill:  0    Order Specific Question:  Supervising Provider    Answer:   Cassandria Anger [[id number]]  . dextromethorphan-guaiFENesin (MUCINEX DM) 30-600 MG 12hr tablet    Sig: Take 1 tablet by mouth 2 (two) times daily as needed for cough.    Dispense:  14 tablet    Refill:  0    Order Specific Question:   Supervising Provider    Answer:   Cassandria Anger [[id number]]  . DISCONTD: levofloxacin (LEVAQUIN) 500 MG tablet    Sig: Take 1 tablet (500 mg total) by mouth daily.    Dispense:  7 tablet    Refill:  0    Order Specific Question:   Supervising Provider    Answer:   Cassandria Anger [[id number]]  . levofloxacin (LEVAQUIN) 500 MG tablet    Sig: Take 1 tablet (500 mg total) by mouth daily.    Dispense:  7 tablet    Refill:  0    Order Specific Question:   Supervising Provider    Answer:   Cassandria Anger [[id number]]    Follow-up: Return in about 1 week (around 10/05/2016), or if symptoms worsen or fail to improve, for CAP re eval.  Wilfred Lacy, NP

## 2016-09-28 NOTE — Patient Instructions (Signed)
URI Instructions: Flonase and Afrin use: apply 1spray of afrin in each nare, wait 43mins, then apply 2sprays of flonase in each nare. Use both nasal spray consecutively x 3days, then flonase only for at least 14days.  Use over-the-counter  "cold" medicines  such as "Tylenol cold" , "Advil cold",  "Mucinex" or" Mucinex D"  for cough and congestion.  Avoid decongestants if you have high blood pressure. Use" Delsym" or" Robitussin" cough syrup varietis for cough.  You can use plain "Tylenol" or "Advi"l for fever, chills and achyness.   will call with CXR results Influenza test was negative

## 2016-09-28 NOTE — Progress Notes (Signed)
Pre visit review using our clinic review tool, if applicable. No additional management support is needed unless otherwise documented below in the visit note. 

## 2016-09-29 MED ORDER — LEVOFLOXACIN 500 MG PO TABS: 500.0000 mg | ORAL_TABLET | Freq: Every day | ORAL | 0 refills | Status: DC

## 2016-10-02 ENCOUNTER — Telehealth: Payer: Self-pay | Admitting: Nurse Practitioner

## 2016-10-02 NOTE — Telephone Encounter (Signed)
Patient advised of charlottes note

## 2016-10-02 NOTE — Telephone Encounter (Signed)
Yes, she can return to work. She still is to maintain upcoming appointment on Monday.

## 2016-10-02 NOTE — Telephone Encounter (Signed)
Patient states she is seeing improvement but still has an achy side from coughing.  States coughing has calmed down some.  Patient states she would like to know when she can go back to work.  States appetite has gotten better as well.

## 2016-10-02 NOTE — Telephone Encounter (Signed)
Routing to charlotte----please advise, I will call patient back, thanks

## 2016-10-05 ENCOUNTER — Ambulatory Visit: Payer: BLUE CROSS/BLUE SHIELD | Admitting: Nurse Practitioner

## 2016-10-13 ENCOUNTER — Ambulatory Visit (INDEPENDENT_AMBULATORY_CARE_PROVIDER_SITE_OTHER): Payer: BLUE CROSS/BLUE SHIELD | Admitting: Women's Health

## 2016-10-13 ENCOUNTER — Encounter: Payer: Self-pay | Admitting: Women's Health

## 2016-10-13 VITALS — BP 126/80 | Ht 65.0 in | Wt 182.0 lb

## 2016-10-13 DIAGNOSIS — Z720 Tobacco use: Secondary | ICD-10-CM | POA: Diagnosis not present

## 2016-10-13 DIAGNOSIS — Z716 Tobacco abuse counseling: Secondary | ICD-10-CM

## 2016-10-13 DIAGNOSIS — Z113 Encounter for screening for infections with a predominantly sexual mode of transmission: Secondary | ICD-10-CM | POA: Diagnosis not present

## 2016-10-13 DIAGNOSIS — Z01419 Encounter for gynecological examination (general) (routine) without abnormal findings: Secondary | ICD-10-CM

## 2016-10-13 MED ORDER — VARENICLINE TARTRATE 0.5 MG PO TABS: 0.5000 mg | ORAL_TABLET | Freq: Two times a day (BID) | ORAL | 0 refills | Status: DC

## 2016-10-13 NOTE — Patient Instructions (Addendum)
Mammogram  [facility] center [phone]  Basic Carbohydrate Counting for Diabetes Mellitus Carbohydrate counting is a method for keeping track of the amount of carbohydrates you eat. Eating carbohydrates naturally increases the level of sugar (glucose) in your blood, so it is important for you to know the amount that is okay for you to have in every meal. Carbohydrate counting helps keep the level of glucose in your blood within normal limits. The amount of carbohydrates allowed is different for every person. A dietitian can help you calculate the amount that is right for you. Once you know the amount of carbohydrates you can have, you can count the carbohydrates in the foods you want to eat. Carbohydrates are found in the following foods:  Grains, such as breads and cereals.  Dried beans and soy products.  Starchy vegetables, such as potatoes, peas, and corn.  Fruit and fruit juices.  Milk and yogurt.  Sweets and snack foods, such as cake, cookies, candy, chips, soft drinks, and fruit drinks. CARBOHYDRATE COUNTING There are two ways to count the carbohydrates in your food. You can use either of the methods or a combination of both. Reading the "Nutrition Facts" on [place] The "Nutrition Facts" is an area that is included on the labels of almost all packaged food and beverages in the [place]. It includes the serving size of that food or beverage and information about the nutrients in each serving of the food, including the grams (g) of carbohydrate per serving.  Decide the number of servings of this food or beverage that you will be able to eat or drink. Multiply that number of servings by the number of grams of carbohydrate that is listed on the label for that serving. The total will be the amount of carbohydrates you will be having when you eat or drink this food or beverage. Learning Standard Serving Sizes of Food When you eat food that is not packaged or does not include "Nutrition  Facts" on the label, you need to measure the servings in order to count the amount of carbohydrates.A serving of most carbohydrate-rich foods contains about 15 g of carbohydrates. The following list includes serving sizes of carbohydrate-rich foods that provide 15 g ofcarbohydrate per serving:   1 slice of bread (1 oz) or 1 six-inch tortilla.    of a hamburger bun or English muffin.  4-6 crackers.   cup unsweetened dry cereal.    cup hot cereal.   cup rice or pasta.    cup mashed potatoes or  of a large baked potato.  1 cup fresh fruit or one small piece of fruit.    cup canned or frozen fruit or fruit juice.  1 cup milk.   cup plain fat-free yogurt or yogurt sweetened with artificial sweeteners.   cup cooked dried beans or starchy vegetable, such as peas, corn, or potatoes.  Decide the number of standard-size servings that you will eat. Multiply that number of servings by 15 (the grams of carbohydrates in that serving). For example, if you eat 2 cups of strawberries, you will have eaten 2 servings and 30 g of carbohydrates (2 servings x 15 g = 30 g). For foods such as soups and casseroles, in which more than one food is mixed in, you will need to count the carbohydrates in each food that is included. EXAMPLE OF CARBOHYDRATE COUNTING Sample Dinner  3 oz chicken breast.   cup of brown rice.   cup of corn.  1 cup milk.  1 cup strawberries with sugar-free whipped topping.  Carbohydrate Calculation Step 1: Identify the foods that contain carbohydrates:   Rice.   Corn.   Milk.   Strawberries. Step 2:Calculate the number of servings eaten of each:   2 servings of rice.   1 serving of corn.   1 serving of milk.   1 serving of strawberries. Step 3: Multiply each of those number of servings by 15 g:   2 servings of rice x 15 g = 30 g.   1 serving of corn x 15 g = 15 g.   1 serving of milk x 15 g = 15 g.   1 serving of  strawberries x 15 g = 15 g. Step 4: Add together all of the amounts to find the total grams of carbohydrates eaten: 30 g + 15 g + 15 g + 15 g = 75 g.   This information is not intended to replace advice given to you by your health care provider. Make sure you discuss any questions you have with your health care provider.   Document Released: 12/14/2005 Document Revised: 01/04/2015 Document Reviewed: 11/10/2013 Elsevier Interactive Patient Education [employer]. Menopause is a normal process in which your reproductive ability comes to an end. This process happens gradually over a span of months to years, usually between the ages of 1 and 32. Menopause is complete when you have missed 12 consecutive menstrual periods. It is important to talk with your health care provider about some of the most common conditions that affect postmenopausal women, such as heart disease, cancer, and bone loss (osteoporosis). Adopting a healthy lifestyle and getting preventive care can help to promote your health and wellness. Those actions can also lower your chances of developing some of these common conditions. WHAT SHOULD I KNOW ABOUT MENOPAUSE? During menopause, you may experience a number of symptoms, such as:  Moderate-to-severe hot flashes.  Night sweats.  Decrease in sex drive.  Mood swings.  Headaches.  Tiredness.  Irritability.  Memory problems.  Insomnia. Choosing to treat or not to treat menopausal changes is an individual decision that you make with your health care provider. WHAT SHOULD I KNOW ABOUT HORMONE REPLACEMENT THERAPY AND SUPPLEMENTS? Hormone therapy products are effective for treating symptoms that are associated with menopause, such as hot flashes and night sweats. Hormone replacement carries certain risks, especially as you become older. If you are thinking about using estrogen or estrogen with progestin treatments, discuss the benefits and risks with your health care  provider. WHAT SHOULD I KNOW ABOUT HEART DISEASE AND STROKE? Heart disease, heart attack, and stroke become more likely as you age. This may be due, in part, to the hormonal changes that your body experiences during menopause. These can affect how your body processes dietary fats, triglycerides, and cholesterol. Heart attack and stroke are both medical emergencies. There are many things that you can do to help prevent heart disease and stroke:  Have your blood pressure checked at least every 1-2 years. High blood pressure causes heart disease and increases the risk of stroke.  If you are 67-11 years old, ask your health care provider if you should take aspirin to prevent a heart attack or a stroke.  Do not use any tobacco products, including cigarettes, chewing tobacco, or electronic cigarettes. If you need help quitting, ask your health care provider.  It is important to eat a healthy diet and maintain a healthy weight.  Be sure to include plenty of vegetables,  fruits, low-fat dairy products, and lean protein.  Avoid eating foods that are high in solid fats, added sugars, or salt (sodium).  Get regular exercise. This is one of the most important things that you can do for your health.  Try to exercise for at least 150 minutes each week. The type of exercise that you do should increase your heart rate and make you sweat. This is known as moderate-intensity exercise.  Try to do strengthening exercises at least twice each week. Do these in addition to the moderate-intensity exercise.  Know your numbers.Ask your health care provider to check your cholesterol and your blood glucose. Continue to have your blood tested as directed by your health care provider. WHAT SHOULD I KNOW ABOUT CANCER SCREENING? There are several types of cancer. Take the following steps to reduce your risk and to catch any cancer development as early as possible. Breast Cancer  Practice breast self-awareness.  This  means understanding how your breasts normally appear and feel.  It also means doing regular breast self-exams. Let your health care provider know about any changes, no matter how small.  If you are 76 or older, have a clinician do a breast exam (clinical breast exam or CBE) every year. Depending on your age, family history, and medical history, it may be recommended that you also have a yearly breast X-ray (mammogram).  If you have a family history of breast cancer, talk with your health care provider about genetic screening.  If you are at high risk for breast cancer, talk with your health care provider about having an MRI and a mammogram every year.  Breast cancer (BRCA) gene test is recommended for women who have family members with BRCA-related cancers. Results of the assessment will determine the need for genetic counseling and BRCA1 and for BRCA2 testing. BRCA-related cancers include these types:  Breast. This occurs in males or females.  Ovarian.  Tubal. This may also be called fallopian tube cancer.  Cancer of the abdominal or pelvic lining (peritoneal cancer).  Prostate.  Pancreatic. Cervical, Uterine, and Ovarian Cancer Your health care provider may recommend that you be screened regularly for cancer of the pelvic organs. These include your ovaries, uterus, and vagina. This screening involves a pelvic exam, which includes checking for microscopic changes to the surface of your cervix (Pap test).  For women ages 21-65, health care providers may recommend a pelvic exam and a Pap test every three years. For women ages 56-65, they may recommend the Pap test and pelvic exam, combined with testing for human papilloma virus (HPV), every five years. Some types of HPV increase your risk of cervical cancer. Testing for HPV may also be done on women of any age who have unclear Pap test results.  Other health care providers may not recommend any screening for nonpregnant women who are  considered low risk for pelvic cancer and have no symptoms. Ask your health care provider if a screening pelvic exam is right for you.  If you have had past treatment for cervical cancer or a condition that could lead to cancer, you need Pap tests and screening for cancer for at least 20 years after your treatment. If Pap tests have been discontinued for you, your risk factors (such as having a new sexual partner) need to be reassessed to determine if you should start having screenings again. Some women have medical problems that increase the chance of getting cervical cancer. In these cases, your health care provider may recommend  that you have screening and Pap tests more often.  If you have a family history of uterine cancer or ovarian cancer, talk with your health care provider about genetic screening.  If you have vaginal bleeding after reaching menopause, tell your health care provider.  There are currently no reliable tests available to screen for ovarian cancer. Lung Cancer Lung cancer screening is recommended for adults 6-34 years old who are at high risk for lung cancer because of a history of smoking. A yearly low-dose CT scan of the lungs is recommended if you:  Currently smoke.  Have a history of at least 30 pack-years of smoking and you currently smoke or have quit within the past 15 years. A pack-year is smoking an average of one pack of cigarettes per day for one year. Yearly screening should:  Continue until it has been 15 years since you quit.  Stop if you develop a health problem that would prevent you from having lung cancer treatment. Colorectal Cancer  This type of cancer can be detected and can often be prevented.  Routine colorectal cancer screening usually begins at age 4 and continues through age 35.  If you have risk factors for colon cancer, your health care provider may recommend that you be screened at an earlier age.  If you have a family history of  colorectal cancer, talk with your health care provider about genetic screening.  Your health care provider may also recommend using home test kits to check for hidden blood in your stool.  A small camera at the end of a tube can be used to examine your colon directly (sigmoidoscopy or colonoscopy). This is done to check for the earliest forms of colorectal cancer.  Direct examination of the colon should be repeated every 5-10 years until age 31. However, if early forms of precancerous polyps or small growths are found or if you have a family history or genetic risk for colorectal cancer, you may need to be screened more often. Skin Cancer  Check your skin from head to toe regularly.  Monitor any moles. Be sure to tell your health care provider:  About any new moles or changes in moles, especially if there is a change in a mole's shape or color.  If you have a mole that is larger than the size of a pencil eraser.  If any of your family members has a history of skin cancer, especially at a [place] Levesque age, talk with your health care provider about genetic screening.  Always use sunscreen. Apply sunscreen liberally and repeatedly throughout the day.  Whenever you are outside, protect yourself by wearing long sleeves, pants, a wide-brimmed hat, and sunglasses. WHAT SHOULD I KNOW ABOUT OSTEOPOROSIS? Osteoporosis is a condition in which bone destruction happens more quickly than new bone creation. After menopause, you may be at an increased risk for osteoporosis. To help prevent osteoporosis or the bone fractures that can happen because of osteoporosis, the following is recommended:  If you are 24-51 years old, get at least 1,000 mg of calcium and at least 600 mg of vitamin D per day.  If you are older than age 89 but younger than age 26, get at least 1,200 mg of calcium and at least 600 mg of vitamin D per day.  If you are older than age 79, get at least 1,200 mg of calcium and at least 800 mg of  vitamin D per day. Smoking and excessive alcohol intake increase the risk of osteoporosis. Eat  foods that are rich in calcium and vitamin D, and do weight-bearing exercises several times each week as directed by your health care provider. WHAT SHOULD I KNOW ABOUT HOW MENOPAUSE AFFECTS [place]? Depression may occur at any age, but it is more common as you become older. Common symptoms of depression include:  Low or sad mood.  Changes in sleep patterns.  Changes in appetite or eating patterns.  Feeling an overall lack of motivation or enjoyment of activities that you previously enjoyed.  Frequent crying spells. Talk with your health care provider if you think that you are experiencing depression. WHAT SHOULD I KNOW ABOUT IMMUNIZATIONS? It is important that you get and maintain your immunizations. These include:  Tetanus, diphtheria, and pertussis (Tdap) booster vaccine.  Influenza every year before the flu season begins.  Pneumonia vaccine.  Shingles vaccine. Your health care provider may also recommend other immunizations.   This information is not intended to replace advice given to you by your health care provider. Make sure you discuss any questions you have with your health care provider.   Document Released: 02/05/2006 Document Revised: 01/04/2015 Document Reviewed: 08/16/2014 Elsevier Interactive Patient Education 2016 [employer]. Smoking Cessation, Tips for Success If you are ready to quit smoking, congratulations! You have chosen to help yourself be healthier. Cigarettes bring nicotine, tar, carbon monoxide, and other irritants into your body. Your lungs, heart, and blood vessels will be able to work better without these poisons. There are many different ways to quit smoking. Nicotine gum, nicotine patches, a nicotine inhaler, or nicotine nasal spray can help with physical craving. Hypnosis, support groups, and medicines help break the habit of smoking. WHAT THINGS  CAN I DO TO MAKE QUITTING EASIER?  Here are some tips to help you quit for good:  Pick a date when you will quit smoking completely. Tell all of your friends and family about your plan to quit on that date.  Do not try to slowly cut down on the number of cigarettes you are smoking. Pick a quit date and quit smoking completely starting on that day.  Throw away all cigarettes.   Clean and remove all ashtrays from your home, work, and car.  On a card, write down your reasons for quitting. Carry the card with you and read it when you get the urge to smoke.  Cleanse your body of nicotine. Drink enough water and fluids to keep your urine clear or pale yellow. Do this after quitting to flush the nicotine from your body.  Learn to predict your moods. Do not let a bad situation be your excuse to have a cigarette. Some situations in your life might tempt you into wanting a cigarette.  Never have "just one" cigarette. It leads to wanting another and another. Remind yourself of your decision to quit.  Change habits associated with smoking. If you smoked while driving or when feeling stressed, try other activities to replace smoking. Stand up when drinking your coffee. Brush your teeth after eating. Sit in a different chair when you read the paper. Avoid alcohol while trying to quit, and try to drink fewer caffeinated beverages. Alcohol and caffeine may urge you to smoke.  Avoid foods and drinks that can trigger a desire to smoke, such as sugary or spicy foods and alcohol.  Ask people who smoke not to smoke around you.  Have something planned to do right after eating or having a cup of coffee. For example, plan to take a walk  or exercise.  Try a relaxation exercise to calm you down and decrease your stress. Remember, you may be tense and nervous for the first 2 weeks after you quit, but this will pass.  Find new activities to keep your hands busy. Play with a pen, coin, or rubber band. Doodle or  draw things on paper.  Brush your teeth right after eating. This will help cut down on the craving for the taste of tobacco after meals. You can also try mouthwash.   Use oral substitutes in place of cigarettes. Try using lemon drops, carrots, cinnamon sticks, or chewing gum. Keep them handy so they are available when you have the urge to smoke.  When you have the urge to smoke, try deep breathing.  Designate your home as a nonsmoking area.  If you are a heavy smoker, ask your health care provider about a prescription for nicotine chewing gum. It can ease your withdrawal from nicotine.  Reward yourself. Set aside the cigarette money you save and buy yourself something nice.  Look for support from others. Join a support group or smoking cessation program. Ask someone at home or at work to help you with your plan to quit smoking.  Always ask yourself, "Do I need this cigarette or is this just a reflex?" Tell yourself, "Today, I choose not to smoke," or "I do not want to smoke." You are reminding yourself of your decision to quit.  Do not replace cigarette smoking with electronic cigarettes (commonly called e-cigarettes). The safety of e-cigarettes is unknown, and some may contain harmful chemicals.  If you relapse, do not give up! Plan ahead and think about what you will do the next time you get the urge to smoke. HOW WILL I FEEL WHEN I QUIT SMOKING? You may have symptoms of withdrawal because your body is used to nicotine (the addictive substance in cigarettes). You may crave cigarettes, be irritable, feel very hungry, cough often, get headaches, or have difficulty concentrating. The withdrawal symptoms are only temporary. They are strongest when you first quit but will go away within 10-14 days. When withdrawal symptoms occur, stay in control. Think about your reasons for quitting. Remind yourself that these are signs that your body is healing and getting used to being without cigarettes.  Remember that withdrawal symptoms are easier to treat than the major diseases that smoking can cause.  Even after the withdrawal is over, expect periodic urges to smoke. However, these cravings are generally short lived and will go away whether you smoke or not. Do not smoke! WHAT RESOURCES ARE AVAILABLE TO HELP ME QUIT SMOKING? Your health care provider can direct you to community resources or hospitals for support, which may include:  Group support.  Education.  Hypnosis.  Therapy.   This information is not intended to replace advice given to you by your health care provider. Make sure you discuss any questions you have with your health care provider.   Document Released: 09/11/2004 Document Revised: 01/04/2015 Document Reviewed: 06/01/2013 Elsevier Interactive Patient Education [employer].

## 2016-10-13 NOTE — Progress Notes (Signed)
Cynthia Ware Nov 20, 1966 [id number]    History:    Presents for annual exam. Recent right lobe pneumonia treated by primary care, feels better. Postmenopausal/ no HRT. New partner with condom use. Hx of normal paps and mammograms. 2017 mammogram needed. Hx of HTN- managed by primary care. 12/02/2015-colonoscopy negative polyp, 10 year follow-up.  Current smoker, report smoking 1 pack every 3-4 days.   Past medical history, past surgical history, family history and social history were all reviewed and documented in the EPIC chart. 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 One daughter doing well. Parents and sister-hypertension.  ROS:  A ROS was performed and pertinent positives and negatives are included.  Exam:  Vitals:   10/13/16 0840  BP: 126/80  Weight: 182 lb (82.6 kg)  Height: 5\' 5"  (1.651 m)   Body mass index is 30.29 kg/m.   General appearance:  Appears well Thyroid:  Symmetrical, normal in size, without palpable masses or nodularity. Respiratory  Auscultation:  Clear; diminished breath sounds Cardiovascular  Auscultation:  Regular rate, without rubs, murmurs or gallops  Edema/varicosities:  Not grossly evident Abdominal  Soft,nontender, without masses, guarding or rebound.  Liver/spleen:  No organomegaly noted  Hernia:  None appreciated  Skin  Inspection:  Grossly normal   Breasts: Examined lying and sitting.     Right: Without masses, retractions, discharge or axillary adenopathy.     Left: Without masses, retractions, discharge or axillary adenopathy. Gentitourinary   Inguinal/mons:  Normal without inguinal adenopathy  External genitalia:  Normal  BUS/Urethra/Skene's glands:  Normal  Vagina:  Normal  Cervix:  Normal; scant white discharge.  Uterus:  anteverted, normal in size, shape and contour.  Midline and mobile  Adnexa/parametria:     Rt: Without masses or tenderness.   Lt: Without masses or tenderness.  Anus and perineum: Normal  Digital  rectal exam: Normal sphincter tone without palpated masses or tenderness  Assessment/Plan:  50 y.o. SBF G1P1 for annual exam.     Postmenopausal/ no HRT/no bleeding.  Hypertension- Primary care manages labs and meds STD Screen Smoking Cessation    Plan: Options for smoking cessation reviewed, requested Chantix 0.5 mg twice daily start instructions reviewed instructed to call for continuing packs.. Education on smoking cessation, low carb heart healthy diet, and exercise. Continue SBE's and annual mammogram. [facility] information given, instructed to schedule. Advised Flu and PNA vaccine once feeling better at primary care. Continue condom use. GC/Chyalmdia, RPR, HIV, and Hep C &B pending.       Enigma, 9:28 AM 10/13/2016

## 2016-10-14 LAB — GC/CHLAMYDIA PROBE AMP
CT Probe RNA: NOT DETECTED
GC Probe RNA: NOT DETECTED

## 2016-10-14 LAB — HEPATITIS B SURFACE ANTIGEN: HEP B S AG: NEGATIVE

## 2016-10-14 LAB — HEPATITIS C ANTIBODY: HCV AB: NEGATIVE

## 2016-10-14 LAB — RPR

## 2016-10-14 LAB — HIV ANTIBODY (ROUTINE TESTING W REFLEX): HIV 1&2 Ab, 4th Generation: NONREACTIVE

## 2016-11-13 ENCOUNTER — Ambulatory Visit (HOSPITAL_COMMUNITY)
Admission: EM | Admit: 2016-11-13 | Discharge: 2016-11-13 | Disposition: A | Discharge status: Home / Self Care | Payer: BLUE CROSS/BLUE SHIELD | Attending: Family Medicine | Admitting: Family Medicine

## 2016-11-13 ENCOUNTER — Encounter (HOSPITAL_COMMUNITY): Payer: Self-pay

## 2016-11-13 DIAGNOSIS — I10 Essential (primary) hypertension: Secondary | ICD-10-CM | POA: Diagnosis not present

## 2016-11-13 DIAGNOSIS — M62838 Other muscle spasm: Secondary | ICD-10-CM

## 2016-11-13 MED ORDER — CYCLOBENZAPRINE HCL 10 MG PO TABS: ORAL_TABLET | ORAL | 0 refills | Status: DC

## 2016-11-13 MED ORDER — NAPROXEN 500 MG PO TABS: 500.0000 mg | ORAL_TABLET | Freq: Two times a day (BID) | ORAL | 0 refills | Status: DC

## 2016-11-13 NOTE — Discharge Instructions (Signed)
You have muscle spasms of your neck. You should continue with moist heat. Use of salon pas may be helpful. Treat with antiinflammatory (naproxen) and use of Flexeril (muscle relaxer) as needed. Please f/u with your PCP for blood pressure and if you neck pain worsens. You may need physical therapy.

## 2016-11-13 NOTE — ED Provider Notes (Signed)
CSN: [id number]     Arrival date & time 11/13/16  1616 History   First MD Initiated Contact with Patient 11/13/16 1621     Chief Complaint  Patient presents with  . Torticollis   (Consider location/radiation/quality/duration/timing/severity/associated sxs/prior Treatment) 50 yo with a history of hypertension and recent CAP presents with neck pain.  She states that she awoke to pain in the left side of neck. Pain with ROM, feels "stiff". No history of neck issues. Feels that she "slept wrong". No radicular pain. No weakness or numbness in the upper extremities.       Past Medical History:  Diagnosis Date  . Arthritis    Foot  . Depression   . Hypertension    Past Surgical History:  Procedure Laterality Date  . TUBAL LIGATION  2003   Family History  Problem Relation Age of Onset  . Hypertension Mother   . Cancer Father     prostate  . Hypertension Father   . Hypertension Sister   . Colon cancer Neg Hx   . Esophageal cancer Neg Hx   . Rectal cancer Neg Hx   . Stomach cancer Neg Hx    Social History  Substance Use Topics  . Smoking status: Current Every Day Smoker    Packs/day: 0.50    Years: 20.00    Types: Cigarettes  . Smokeless tobacco: Never Used  . Alcohol use 0.0 oz/week     Comment: socially   OB History    Gravida Para Term Preterm AB Living   1 0 0 0 1 1   SAB TAB Ectopic Multiple Live Births   0 0 0 0       Review of Systems  Constitutional: Negative for fever.  Respiratory: Negative for chest tightness and shortness of breath.   Musculoskeletal: Positive for neck pain and neck stiffness.  Skin: Negative for rash.  Neurological: Negative for tremors, weakness and numbness.  Psychiatric/Behavioral: Negative.     Allergies  Lisinopril-hydrochlorothiazide  Home Medications   Prior to Admission medications   Medication Sig Start Date End Date Taking? Authorizing Provider  amLODipine (NORVASC) 10 MG tablet Take 1 tablet (10 mg total) by mouth  daily. 08/06/16  Yes Golden Circle, FNP  albuterol (PROVENTIL HFA;VENTOLIN HFA) 108 (90 Base) MCG/ACT inhaler Inhale 2 puffs into the lungs every 6 (six) hours as needed for wheezing or shortness of breath. 09/28/16   Flossie Buffy, NP  cyclobenzaprine (FLEXERIL) 10 MG tablet 1/2 to 1 tablet every 8 hours as needed for muscle spasms 11/13/16   Bjorn Pippin, PA-C  dextromethorphan-guaiFENesin [facility] DM) 30-600 MG 12hr tablet Take 1 tablet by mouth 2 (two) times daily as needed for cough. 09/28/16   Flossie Buffy, NP  fluticasone (FLONASE) 50 MCG/ACT nasal spray Place 2 sprays into both nostrils daily. 09/28/16   Flossie Buffy, NP  HYDROcodone-homatropine (HYCODAN) 5-1.5 MG/5ML syrup Take 5 mLs by mouth at bedtime as needed for cough. 09/28/16   Flossie Buffy, NP  meloxicam (MOBIC) 15 MG tablet Take 1 tablet (15 mg total) by mouth daily. 08/14/16   Golden Circle, FNP  Multiple Vitamin (MULTIVITAMIN) tablet Take 1 tablet by mouth daily.    Historical Provider, MD  naproxen (NAPROSYN) 500 MG tablet Take 1 tablet (500 mg total) by mouth 2 (two) times daily with a meal. 11/13/16   Bjorn Pippin, PA-C  varenicline (CHANTIX) 0.5 MG tablet Take 1 tablet (0.5 mg total) by mouth 2 (  two) times daily. 10/13/16   Huel Cote, NP   Meds Ordered and Administered this Visit  Medications - No data to display  BP 143/91 (BP Location: Right Arm)   Pulse 93   Temp 97.7 F (36.5 C) (Oral)   Resp 16   Ht 5' 5.5" (1.664 m)   Wt 182 lb (82.6 kg)   LMP 03/09/2012   SpO2 96%   BMI 29.83 kg/m  No data found.   Physical Exam  Constitutional: She is oriented to person, place, and time. She appears well-developed and well-nourished. No distress.  HENT:  Head: Normocephalic and atraumatic.  Neck: Neck supple.  Tenderness to the left SCM with pain to ROM in all planes  Musculoskeletal: She exhibits tenderness. She exhibits no edema or deformity.  Full ROM of the shoulders, see neck  exam  Lymphadenopathy:    She has no cervical adenopathy.  Neurological: She is alert and oriented to person, place, and time. She displays normal reflexes. No sensory deficit. She exhibits normal muscle tone. Coordination normal.  Skin: Skin is warm and dry. She is not diaphoretic.  Psychiatric: Her behavior is normal.  Nursing note and vitals reviewed.   Urgent Care Course   Clinical Course     Procedures (including critical care time)  Labs Review Labs Reviewed - No data to display  Imaging Review No results found.   Visual Acuity Review  Right Eye Distance:   Left Eye Distance:   Bilateral Distance:    Right Eye Near:   Left Eye Near:    Bilateral Near:         MDM   1. Muscle spasms of neck   2. Elevated blood pressure reading in office with diagnosis of hypertension    Treat with NSAID's, muscle relaxer's, massage and topicals. F/U with PCP if worsens may need physical therapy also. No neuro deficit. Stable for DC FU with PCP for elevated BP    Bjorn Pippin, PA-C 11/13/16 1644

## 2016-11-13 NOTE — ED Triage Notes (Signed)
Pt said she was sleep Tuesday night and thought she had slept wrong. Has tried heating pads, bengay and has taken extra strength tylenol only hurting on the left side of her neck radiating behind her ear.

## 2016-12-17 ENCOUNTER — Ambulatory Visit (INDEPENDENT_AMBULATORY_CARE_PROVIDER_SITE_OTHER): Payer: BLUE CROSS/BLUE SHIELD | Admitting: Family

## 2016-12-17 ENCOUNTER — Encounter: Payer: Self-pay | Admitting: Family

## 2016-12-17 VITALS — BP 128/80 | HR 78 | Temp 98.1°F | Resp 16 | Ht 65.5 in | Wt 175.0 lb

## 2016-12-17 DIAGNOSIS — J209 Acute bronchitis, unspecified: Secondary | ICD-10-CM | POA: Diagnosis not present

## 2016-12-17 MED ORDER — ALBUTEROL SULFATE HFA 108 (90 BASE) MCG/ACT IN AERS: 2.0000 | INHALATION_SPRAY | Freq: Four times a day (QID) | RESPIRATORY_TRACT | 0 refills | Status: DC | PRN

## 2016-12-17 MED ORDER — AZITHROMYCIN 250 MG PO TABS: ORAL_TABLET | ORAL | 0 refills | Status: DC

## 2016-12-17 MED ORDER — HYDROCODONE-HOMATROPINE 5-1.5 MG/5ML PO SYRP: 5.0000 mL | ORAL_SOLUTION | Freq: Every evening | ORAL | 0 refills | Status: DC | PRN

## 2016-12-17 NOTE — Patient Instructions (Signed)
Thank you for choosing [facility].  SUMMARY AND INSTRUCTIONS:  Medication:  Your prescription(s) have been submitted to your pharmacy or been printed and provided for you. Please take as directed and contact our office if you believe you are having problem(s) with the medication(s) or have any questions.  Follow up:  If your symptoms worsen or fail to improve, please contact our office for further instruction, or in case of emergency go directly to the emergency room at the closest medical facility.    General Recommendations:    Please drink plenty of fluids.  Get plenty of rest   Sleep in humidified air  Use saline nasal sprays  Netti pot   OTC Medications:  Decongestants - helps relieve congestion   Flonase (generic fluticasone) or Nasacort (generic triamcinolone) - please make sure to use the "cross-over" technique at a 45 degree angle towards the opposite eye as opposed to straight up the nasal passageway.   Sudafed (generic pseudoephedrine - Note this is the one that is available behind the pharmacy counter); Products with phenylephrine (-PE) may also be used but is often not as effective as pseudoephedrine.   If you have HIGH BLOOD PRESSURE - Coricidin HBP; AVOID any product that is -D as this contains pseudoephedrine which may increase your blood pressure.  Afrin (oxymetazoline) every 6-8 hours for up to 3 days.   Allergies - helps relieve runny nose, itchy eyes and sneezing   Claritin (generic loratidine), Allegra (fexofenidine), or Zyrtec (generic cyrterizine) for runny nose. These medications should not cause drowsiness.  Note - Benadryl (generic diphenhydramine) may be used however may cause drowsiness  Cough -   Delsym or Robitussin (generic dextromethorphan)  Expectorants - helps loosen mucus to ease removal   Mucinex (generic guaifenesin) as directed on the package.  Headaches / General Aches   Tylenol (generic acetaminophen) - DO NOT  EXCEED 3 grams (3,000 mg) in a 24 hour time period  Advil/Motrin (generic ibuprofen)   Sore Throat -   Salt water gargle   Chloraseptic (generic benzocaine) spray or lozenges / Sucrets (generic dyclonine)    Acute Bronchitis, Adult Acute bronchitis is sudden (acute) swelling of the air tubes (bronchi) in the lungs. Acute bronchitis causes these tubes to fill with mucus, which can make it hard to breathe. It can also cause coughing or wheezing. In adults, acute bronchitis usually goes away within 2 weeks. A cough caused by bronchitis may last up to 3 weeks. Smoking, allergies, and asthma can make the condition worse. Repeated episodes of bronchitis may cause further lung problems, such as chronic obstructive pulmonary disease (COPD). What are the causes? This condition can be caused by germs and by substances that irritate the lungs, including:  Cold and flu viruses. This condition is most often caused by the same virus that causes a cold.  Bacteria.  Exposure to tobacco smoke, dust, fumes, and air pollution. What increases the risk? This condition is more likely to develop in people who:  Have close contact with someone with acute bronchitis.  Are exposed to lung irritants, such as tobacco smoke, dust, fumes, and vapors.  Have a weak immune system.  Have a respiratory condition such as asthma. What are the signs or symptoms? Symptoms of this condition include:  A cough.  Coughing up clear, yellow, or green mucus.  Wheezing.  Chest congestion.  Shortness of breath.  A fever.  Body aches.  Chills.  A sore throat. How is this diagnosed? This condition is usually  diagnosed with a physical exam. During the exam, your health care provider may order tests, such as chest X-rays, to rule out other conditions. He or she may also:  Test a sample of your mucus for bacterial infection.  Check the level of oxygen in your blood. This is done to check for pneumonia.  Do a  chest X-ray or lung function testing to rule out pneumonia and other conditions.  Perform blood tests. Your health care provider will also ask about your symptoms and medical history. How is this treated? Most cases of acute bronchitis clear up over time without treatment. Your health care provider may recommend:  Drinking more fluids. Drinking more makes your mucus thinner, which may make it easier to breathe.  Taking a medicine for a fever or cough.  Taking an antibiotic medicine.  Using an inhaler to help improve shortness of breath and to control a cough.  Using a cool mist vaporizer or humidifier to make it easier to breathe. Follow these instructions at home: Medicines  Take over-the-counter and prescription medicines only as told by your health care provider.  If you were prescribed an antibiotic, take it as told by your health care provider. Do not stop taking the antibiotic even if you start to feel better. General instructions  Get plenty of rest.  Drink enough fluids to keep your urine clear or pale yellow.  Avoid smoking and secondhand smoke. Exposure to cigarette smoke or irritating chemicals will make bronchitis worse. If you smoke and you need help quitting, ask your health care provider. Quitting smoking will help your lungs heal faster.  Use an inhaler, cool mist vaporizer, or humidifier as told by your health care provider.  Keep all follow-up visits as told by your health care provider. This is important. How is this prevented? To lower your risk of getting this condition again:  Wash your hands often with soap and water. If soap and water are not available, use hand sanitizer.  Avoid contact with people who have cold symptoms.  Try not to touch your hands to your mouth, nose, or eyes.  Make sure to get the flu shot every year. Contact a health care provider if:  Your symptoms do not improve in 2 weeks of treatment. Get help right away if:  You cough  up blood.  You have chest pain.  You have severe shortness of breath.  You become dehydrated.  You faint or keep feeling like you are going to faint.  You keep vomiting.  You have a severe headache.  Your fever or chills gets worse. This information is not intended to replace advice given to you by your health care provider. Make sure you discuss any questions you have with your health care provider. Document Released: 01/21/2005 Document Revised: 07/08/2016 Document Reviewed: 06/03/2016 Elsevier Interactive Patient Education  2017 [employer].

## 2016-12-17 NOTE — Assessment & Plan Note (Signed)
Symptoms and exam consistent with acute bronchitis. Start azithromycin. Start Hycodan as needed for cough and sleep. Start albuterol as needed for wheezing. Continue over-the-counter medications as needed for symptom relief and supportive care. Follow-up if symptoms worsen or do not improve.

## 2016-12-17 NOTE — Progress Notes (Signed)
Subjective:    Patient ID: Cynthia Ware, female    DOB: Oct 18, 1966, 50 y.o.   MRN: [id number]  Chief Complaint  Patient presents with  . Cough    wheezing, cough, chest tightness, chest congestion x1 week    HPI:  Cynthia Ware is a 50 y.o. female who  has a past medical history of Arthritis; Depression; and Hypertension. and presents today for an acute office visit.  This is a new problem. Associated symptoms of wheezing, cough, chest tightness and congestion have been going on for about 1 week. No fevers. Cough is described as productive with yellow mucus. Timing of the symptoms are worse at night. Describes some wheezing when she sleeps. Modifying factors Tylenol which has helped a little. Course of the symptoms has stayed about the same with no significant improvements. Severity of the cough is enough to keep her up at night. Recently completed course of levofloxacin for pneumonia about 2 months ago.  Allergies  Allergen Reactions  . Lisinopril-Hydrochlorothiazide Swelling      Outpatient Medications Prior to Visit  Medication Sig Dispense Refill  . amLODipine (NORVASC) 10 MG tablet Take 1 tablet (10 mg total) by mouth daily. 90 tablet 2  . cyclobenzaprine (FLEXERIL) 10 MG tablet 1/2 to 1 tablet every 8 hours as needed for muscle spasms 20 tablet 0  . fluticasone (FLONASE) 50 MCG/ACT nasal spray Place 2 sprays into both nostrils daily. 16 g 0  . meloxicam (MOBIC) 15 MG tablet Take 1 tablet (15 mg total) by mouth daily. 30 tablet 0  . Multiple Vitamin (MULTIVITAMIN) tablet Take 1 tablet by mouth daily.    . naproxen (NAPROSYN) 500 MG tablet Take 1 tablet (500 mg total) by mouth 2 (two) times daily with a meal. 30 tablet 0  . varenicline (CHANTIX) 0.5 MG tablet Take 1 tablet (0.5 mg total) by mouth 2 (two) times daily. 60 tablet 0  . albuterol (PROVENTIL HFA;VENTOLIN HFA) 108 (90 Base) MCG/ACT inhaler Inhale 2 puffs into the lungs every 6 (six) hours as needed for wheezing or  shortness of breath. 1 Inhaler 0  . dextromethorphan-guaiFENesin (MUCINEX DM) 30-600 MG 12hr tablet Take 1 tablet by mouth 2 (two) times daily as needed for cough. 14 tablet 0  . HYDROcodone-homatropine (HYCODAN) 5-1.5 MG/5ML syrup Take 5 mLs by mouth at bedtime as needed for cough. 120 mL 0   No facility-administered medications prior to visit.       Past Surgical History:  Procedure Laterality Date  . TUBAL LIGATION  2003      Past Medical History:  Diagnosis Date  . Arthritis    Foot  . Depression   . Hypertension       Review of Systems  Constitutional: Negative for chills and fever.  HENT: Positive for congestion. Negative for sinus pain, sinus pressure and sore throat.   Respiratory: Positive for choking. Negative for chest tightness and shortness of breath.   Neurological: Negative for headaches.      Objective:    BP 128/80 (BP Location: Left Arm, Patient Position: Sitting, Cuff Size: Large)   Pulse 78   Temp 98.1 F (36.7 C) (Oral)   Resp 16   Ht 5' 5.5" (1.664 m)   Wt 175 lb (79.4 kg)   LMP 03/09/2012   SpO2 94%   BMI 28.68 kg/m  Nursing note and vital signs reviewed.  Physical Exam  Constitutional: She is oriented to person, place, and time. She appears well-developed and well-nourished.  No distress.  HENT:  Right Ear: Hearing, tympanic membrane, external ear and ear canal normal.  Left Ear: Hearing, tympanic membrane, external ear and ear canal normal.  Nose: Nose normal. Right sinus exhibits no maxillary sinus tenderness and no frontal sinus tenderness. Left sinus exhibits no maxillary sinus tenderness and no frontal sinus tenderness.  Mouth/Throat: Uvula is midline, oropharynx is clear and moist and mucous membranes are normal.  Cardiovascular: Normal rate, regular rhythm, normal heart sounds and intact distal pulses.   Pulmonary/Chest: Effort normal. No respiratory distress. She has wheezes. She has no rales. She exhibits no tenderness.    Neurological: She is alert and oriented to person, place, and time.  Skin: Skin is warm and dry.  Psychiatric: She has a normal mood and affect. Her behavior is normal. Judgment and thought content normal.       Assessment & Plan:   Problem List Items Addressed This Visit      Respiratory   Acute bronchitis - Primary    Symptoms and exam consistent with acute bronchitis. Start azithromycin. Start Hycodan as needed for cough and sleep. Start albuterol as needed for wheezing. Continue over-the-counter medications as needed for symptom relief and supportive care. Follow-up if symptoms worsen or do not improve.      Relevant Medications   azithromycin (ZITHROMAX) 250 MG tablet   HYDROcodone-homatropine (HYCODAN) 5-1.5 MG/5ML syrup   albuterol (PROVENTIL HFA;VENTOLIN HFA) 108 (90 Base) MCG/ACT inhaler       I have discontinued Cynthia Ware's dextromethorphan-guaiFENesin. I am also having her start on azithromycin. Additionally, I am having her maintain her multivitamin, amLODipine, meloxicam, fluticasone, varenicline, cyclobenzaprine, naproxen, HYDROcodone-homatropine, and albuterol.   Meds ordered this encounter  Medications  . azithromycin (ZITHROMAX) 250 MG tablet    Sig: Take 2 tablets by mouth for 1 day and then 1 tablet by mouth for 4 days.    Dispense:  6 tablet    Refill:  0    Order Specific Question:   Supervising Provider    Answer:   Pricilla Holm A [id number]  . HYDROcodone-homatropine (HYCODAN) 5-1.5 MG/5ML syrup    Sig: Take 5 mLs by mouth at bedtime as needed for cough.    Dispense:  120 mL    Refill:  0    Order Specific Question:   Supervising Provider    Answer:   Pricilla Holm A [id number]  . albuterol (PROVENTIL HFA;VENTOLIN HFA) 108 (90 Base) MCG/ACT inhaler    Sig: Inhale 2 puffs into the lungs every 6 (six) hours as needed for wheezing or shortness of breath.    Dispense:  1 Inhaler    Refill:  0    Order Specific Question:   Supervising Provider     Answer:   Pricilla Holm A [id number]     Follow-up: Return if symptoms worsen or fail to improve.  Mauricio Po, FNP

## 2017-05-12 ENCOUNTER — Encounter: Payer: Self-pay | Admitting: Gynecology

## 2017-06-24 ENCOUNTER — Other Ambulatory Visit: Payer: Self-pay | Admitting: Family

## 2017-06-25 ENCOUNTER — Other Ambulatory Visit: Payer: Self-pay | Admitting: Family

## 2017-06-28 ENCOUNTER — Other Ambulatory Visit: Payer: Self-pay

## 2017-06-28 MED ORDER — AMLODIPINE BESYLATE 10 MG PO TABS: 10.0000 mg | ORAL_TABLET | Freq: Every day | ORAL | 0 refills | Status: DC

## 2017-07-07 ENCOUNTER — Encounter: Payer: BLUE CROSS/BLUE SHIELD | Admitting: Family

## 2017-07-23 ENCOUNTER — Ambulatory Visit (INDEPENDENT_AMBULATORY_CARE_PROVIDER_SITE_OTHER): Payer: BLUE CROSS/BLUE SHIELD | Admitting: Family

## 2017-07-23 ENCOUNTER — Other Ambulatory Visit (INDEPENDENT_AMBULATORY_CARE_PROVIDER_SITE_OTHER): Payer: BLUE CROSS/BLUE SHIELD

## 2017-07-23 ENCOUNTER — Encounter: Payer: Self-pay | Admitting: Family

## 2017-07-23 VITALS — BP 142/88 | HR 63 | Temp 98.3°F | Resp 16 | Ht 65.5 in | Wt 169.0 lb

## 2017-07-23 DIAGNOSIS — I10 Essential (primary) hypertension: Secondary | ICD-10-CM | POA: Insufficient documentation

## 2017-07-23 DIAGNOSIS — Z1231 Encounter for screening mammogram for malignant neoplasm of breast: Secondary | ICD-10-CM | POA: Diagnosis not present

## 2017-07-23 DIAGNOSIS — Z0001 Encounter for general adult medical examination with abnormal findings: Secondary | ICD-10-CM | POA: Diagnosis not present

## 2017-07-23 DIAGNOSIS — N649 Disorder of breast, unspecified: Secondary | ICD-10-CM

## 2017-07-23 LAB — LIPID PANEL
CHOL/HDL RATIO: 2
Cholesterol: 166 mg/dL (ref 0–200)
HDL: 69 mg/dL (ref >39.00)
LDL CALC: 85 mg/dL (ref 0–99)
NonHDL: 96.94
Triglycerides: 62 mg/dL (ref 0.0–149.0)
VLDL: 12.4 mg/dL (ref 0.0–40.0)

## 2017-07-23 LAB — COMPREHENSIVE METABOLIC PANEL
ALK PHOS: 86 U/L (ref 39–117)
ALT: 12 U/L (ref 0–35)
AST: 18 U/L (ref 0–37)
Albumin: 4.1 g/dL (ref 3.5–5.2)
BUN: 11 mg/dL (ref 6–23)
CHLORIDE: 105 meq/L (ref 96–112)
CO2: 30 meq/L (ref 19–32)
Calcium: 9.2 mg/dL (ref 8.4–10.5)
Creatinine, Ser: 0.77 mg/dL (ref 0.40–1.20)
GFR: 100.71 mL/min (ref >60.00)
GLUCOSE: 110 mg/dL — AB (ref 70–99)
POTASSIUM: 3.8 meq/L (ref 3.5–5.1)
Sodium: 141 mEq/L (ref 135–145)
Total Bilirubin: 0.5 mg/dL (ref 0.2–1.2)
Total Protein: 7.7 g/dL (ref 6.0–8.3)

## 2017-07-23 LAB — CBC
HEMATOCRIT: 39.3 % (ref 36.0–46.0)
HEMOGLOBIN: 12.7 g/dL (ref 12.0–15.0)
MCHC: 32.3 g/dL (ref 30.0–36.0)
MCV: 87.6 fl (ref 78.0–100.0)
Platelets: 332 10*3/uL (ref 150.0–400.0)
RBC: 4.49 Mil/uL (ref 3.87–5.11)
RDW: 16.5 % — AB (ref 11.5–15.5)
WBC: 6.3 10*3/uL (ref 4.0–10.5)

## 2017-07-23 MED ORDER — SULFAMETHOXAZOLE-TRIMETHOPRIM 800-160 MG PO TABS: 1.0000 | ORAL_TABLET | Freq: Two times a day (BID) | ORAL | 0 refills | Status: DC

## 2017-07-23 MED ORDER — FUROSEMIDE 20 MG PO TABS: 20.0000 mg | ORAL_TABLET | Freq: Every day | ORAL | 3 refills | Status: DC | PRN

## 2017-07-23 MED ORDER — METOPROLOL SUCCINATE ER 25 MG PO TB24: 25.0000 mg | ORAL_TABLET | Freq: Every day | ORAL | 1 refills | Status: DC

## 2017-07-23 MED ORDER — CYCLOBENZAPRINE HCL 10 MG PO TABS: 10.0000 mg | ORAL_TABLET | Freq: Three times a day (TID) | ORAL | 0 refills | Status: DC | PRN

## 2017-07-23 NOTE — Assessment & Plan Note (Signed)
1) Anticipatory Guidance: Discussed importance of wearing a seatbelt while driving and not texting while driving; changing batteries in smoke detector at least once annually; wearing suntan lotion when outside; eating a balanced and moderate diet; getting physical activity at least 30 minutes per day.  2) Immunizations / Screenings / Labs:  All immunizations are up-to-date per recommendations. Due for breast cancer screening with order for mammogram placed. Cervical cancer and colon cancer screenings are up-to-date per recommendations. Due for dental exam encouraged to be completed independently. Hepatitis C screening has been completed. Obtain CBC, CMET, and lipid profile.    Overall well exam with risk factors for cardiovascular disease including obesity, tobacco use, and hypertension. Recommend weight loss of 5-10% of current body weight through nutrition and physical activity changes. Increase physical activity to 30 minutes of moderate level activity most days of the week or approximately 10,000 steps per day. Encouraged tobacco cessation. Continue other healthy lifestyle behaviors and choices. Follow-up prevention exam in 1 year. Follow-up office visit pending blood work and for chronic conditions.

## 2017-07-23 NOTE — Patient Instructions (Addendum)
Thank you for choosing [facility].  SUMMARY AND INSTRUCTIONS:  Please STOP taking AMLODIPINE and START taking METOPROLOL.  Start taking the furosemide as needed for edema.  Start the Bactrim (trimetharprim-sulfamethoxasole) until completed.  Cleans with soap and water and keep covered. No alcohol or peroxide.   You will receive a call to schedule your mammogram.   Continue to work on stretching and physical activity.    Medication:  Your prescription(s) have been submitted to your pharmacy or been printed and provided for you. Please take as directed and contact our office if you believe you are having problem(s) with the medication(s) or have any questions.  Labs:  Please stop by the lab on the lower level of the building for your blood work. Your results will be released to [place] (or called to you) after review, usually within 72 hours after test completion. If any changes need to be made, you will be notified at that same time.  1.) The lab is open from 7:30am to 5:30 pm Monday-Friday 2.) No appointment is necessary 3.) Fasting (if needed) is 6-8 hours after food and drink; black coffee and water are okay   Follow up:  If your symptoms worsen or fail to improve, please contact our office for further instruction, or in case of emergency go directly to the emergency room at the closest medical facility.   Health Maintenance, Female Adopting a healthy lifestyle and getting preventive care can go a long way to promote health and wellness. Talk with your health care provider about what schedule of regular examinations is right for you. This is a good chance for you to check in with your provider about disease prevention and staying healthy. In between checkups, there are plenty of things you can do on your own. Experts have done a lot of research about which lifestyle changes and preventive measures are most likely to keep you healthy. Ask your health care provider for more  information. Weight and diet Eat a healthy diet  Be sure to include plenty of vegetables, fruits, low-fat dairy products, and lean protein.  Do not eat a lot of foods high in solid fats, added sugars, or salt.  Get regular exercise. This is one of the most important things you can do for your health. ? Most adults should exercise for at least 150 minutes each week. The exercise should increase your heart rate and make you sweat (moderate-intensity exercise). ? Most adults should also do strengthening exercises at least twice a week. This is in addition to the moderate-intensity exercise.  Maintain a healthy weight  Body mass index (BMI) is a measurement that can be used to identify possible weight problems. It estimates body fat based on height and weight. Your health care provider can help determine your BMI and help you achieve or maintain a healthy weight.  For females 48 years of age and older: ? A BMI below 18.5 is considered underweight. ? A BMI of 18.5 to 24.9 is normal. ? A BMI of 25 to 29.9 is considered overweight. ? A BMI of 30 and above is considered obese.  Watch levels of cholesterol and blood lipids  You should start having your blood tested for lipids and cholesterol at 53 years of age, then have this test every 5 years.  You may need to have your cholesterol levels checked more often if: ? Your lipid or cholesterol levels are high. ? You are older than 53 years of age. ? You are at  high risk for heart disease.  Cancer screening Lung Cancer  Lung cancer screening is recommended for adults 41-38 years old who are at high risk for lung cancer because of a history of smoking.  A yearly low-dose CT scan of the lungs is recommended for people who: ? Currently smoke. ? Have quit within the past 15 years. ? Have at least a 30-pack-year history of smoking. A pack year is smoking an average of one pack of cigarettes a day for 1 year.  Yearly screening should continue  until it has been 15 years since you quit.  Yearly screening should stop if you develop a health problem that would prevent you from having lung cancer treatment.  Breast Cancer  Practice breast self-awareness. This means understanding how your breasts normally appear and feel.  It also means doing regular breast self-exams. Let your health care provider know about any changes, no matter how small.  If you are in your 20s or 30s, you should have a clinical breast exam (CBE) by a health care provider every 1-3 years as part of a regular health exam.  If you are 33 or older, have a CBE every year. Also consider having a breast X-ray (mammogram) every year.  If you have a family history of breast cancer, talk to your health care provider about genetic screening.  If you are at high risk for breast cancer, talk to your health care provider about having an MRI and a mammogram every year.  Breast cancer gene (BRCA) assessment is recommended for women who have family members with BRCA-related cancers. BRCA-related cancers include: ? Breast. ? Ovarian. ? Tubal. ? Peritoneal cancers.  Results of the assessment will determine the need for genetic counseling and BRCA1 and BRCA2 testing.  Cervical Cancer Your health care provider may recommend that you be screened regularly for cancer of the pelvic organs (ovaries, uterus, and vagina). This screening involves a pelvic examination, including checking for microscopic changes to the surface of your cervix (Pap test). You may be encouraged to have this screening done every 3 years, beginning at age 42.  For women ages 77-65, health care providers may recommend pelvic exams and Pap testing every 3 years, or they may recommend the Pap and pelvic exam, combined with testing for human papilloma virus (HPV), every 5 years. Some types of HPV increase your risk of cervical cancer. Testing for HPV may also be done on women of any age with unclear Pap test  results.  Other health care providers may not recommend any screening for nonpregnant women who are considered low risk for pelvic cancer and who do not have symptoms. Ask your health care provider if a screening pelvic exam is right for you.  If you have had past treatment for cervical cancer or a condition that could lead to cancer, you need Pap tests and screening for cancer for at least 20 years after your treatment. If Pap tests have been discontinued, your risk factors (such as having a new sexual partner) need to be reassessed to determine if screening should resume. Some women have medical problems that increase the chance of getting cervical cancer. In these cases, your health care provider may recommend more frequent screening and Pap tests.  Colorectal Cancer  This type of cancer can be detected and often prevented.  Routine colorectal cancer screening usually begins at 53 years of age and continues through 53 years of age.  Your health care provider may recommend screening at an earlier  age if you have risk factors for colon cancer.  Your health care provider may also recommend using home test kits to check for hidden blood in the stool.  A small camera at the end of a tube can be used to examine your colon directly (sigmoidoscopy or colonoscopy). This is done to check for the earliest forms of colorectal cancer.  Routine screening usually begins at age 31.  Direct examination of the colon should be repeated every 5-10 years through 53 years of age. However, you may need to be screened more often if early forms of precancerous polyps or small growths are found.  Skin Cancer  Check your skin from head to toe regularly.  Tell your health care provider about any new moles or changes in moles, especially if there is a change in a mole's shape or color.  Also tell your health care provider if you have a mole that is larger than the size of a pencil eraser.  Always use sunscreen.  Apply sunscreen liberally and repeatedly throughout the day.  Protect yourself by wearing long sleeves, pants, a wide-brimmed hat, and sunglasses whenever you are outside.  Heart disease, diabetes, and high blood pressure  High blood pressure causes heart disease and increases the risk of stroke. High blood pressure is more likely to develop in: ? People who have blood pressure in the high end of the normal range (130-139/85-89 mm Hg). ? People who are overweight or obese. ? People who are African American.  If you are 54-41 years of age, have your blood pressure checked every 3-5 years. If you are 66 years of age or older, have your blood pressure checked every year. You should have your blood pressure measured twice-once when you are at a hospital or clinic, and once when you are not at a hospital or clinic. Record the average of the two measurements. To check your blood pressure when you are not at a hospital or clinic, you can use: ? An automated blood pressure machine at a pharmacy. ? A home blood pressure monitor.  If you are between 82 years and 30 years old, ask your health care provider if you should take aspirin to prevent strokes.  Have regular diabetes screenings. This involves taking a blood sample to check your fasting blood sugar level. ? If you are at a normal weight and have a low risk for diabetes, have this test once every three years after 53 years of age. ? If you are overweight and have a high risk for diabetes, consider being tested at a younger age or more often. Preventing infection Hepatitis B  If you have a higher risk for hepatitis B, you should be screened for this virus. You are considered at high risk for hepatitis B if: ? You were born in a country where hepatitis B is common. Ask your health care provider which countries are considered high risk. ? Your parents were born in a high-risk country, and you have not been immunized against hepatitis B (hepatitis B  vaccine). ? You have HIV or AIDS. ? You use needles to inject street drugs. ? You live with someone who has hepatitis B. ? You have had sex with someone who has hepatitis B. ? You get hemodialysis treatment. ? You take certain medicines for conditions, including cancer, organ transplantation, and autoimmune conditions.  Hepatitis C  Blood testing is recommended for: ? Everyone born from [birth year] through 1966/11/29. ? Anyone with known risk factors for hepatitis C.  Sexually transmitted infections (STIs)  You should be screened for sexually transmitted infections (STIs) including gonorrhea and chlamydia if: ? You are sexually active and are younger than 53 years of age. ? You are older than 53 years of age and your health care provider tells you that you are at risk for this type of infection. ? Your sexual activity has changed since you were last screened and you are at an increased risk for chlamydia or gonorrhea. Ask your health care provider if you are at risk.  If you do not have HIV, but are at risk, it may be recommended that you take a prescription medicine daily to prevent HIV infection. This is called pre-exposure prophylaxis (PrEP). You are considered at risk if: ? You are sexually active and do not regularly use condoms or know the HIV status of your partner(s). ? You take drugs by injection. ? You are sexually active with a partner who has HIV.  Talk with your health care provider about whether you are at high risk of being infected with HIV. If you choose to begin PrEP, you should first be tested for HIV. You should then be tested every 3 months for as long as you are taking PrEP. Pregnancy  If you are premenopausal and you may become pregnant, ask your health care provider about preconception counseling.  If you may become pregnant, take 400 to 800 micrograms (mcg) of folic acid every day.  If you want to prevent pregnancy, talk to your health care provider about birth control  (contraception). Osteoporosis and menopause  Osteoporosis is a disease in which the bones lose minerals and strength with aging. This can result in serious bone fractures. Your risk for osteoporosis can be identified using a bone density scan.  If you are 37 years of age or older, or if you are at risk for osteoporosis and fractures, ask your health care provider if you should be screened.  Ask your health care provider whether you should take a calcium or vitamin D supplement to lower your risk for osteoporosis.  Menopause may have certain physical symptoms and risks.  Hormone replacement therapy may reduce some of these symptoms and risks. Talk to your health care provider about whether hormone replacement therapy is right for you. Follow these instructions at home:  Schedule regular health, dental, and eye exams.  Stay current with your immunizations.  Do not use any tobacco products including cigarettes, chewing tobacco, or electronic cigarettes.  If you are pregnant, do not drink alcohol.  If you are breastfeeding, limit how much and how often you drink alcohol.  Limit alcohol intake to no more than 1 drink per day for nonpregnant women. One drink equals 12 ounces of beer, 5 ounces of wine, or 1 ounces of hard liquor.  Do not use street drugs.  Do not share needles.  Ask your health care provider for help if you need support or information about quitting drugs.  Tell your health care provider if you often feel depressed.  Tell your health care provider if you have ever been abused or do not feel safe at home. This information is not intended to replace advice given to you by your health care provider. Make sure you discuss any questions you have with your health care provider. Document Released: 06/29/2011 Document Revised: 05/21/2016 Document Reviewed: 09/17/2015 Elsevier Interactive Patient Education  [employer].

## 2017-07-23 NOTE — Assessment & Plan Note (Signed)
Blood pressure was marginal controlled current medication regimen and the adverse side effect of lower extremity edema. Discontinue amlodipine. Start furosemide as needed for edema. Start metoprolol for blood pressure control. Denies worst headache of life with any symptoms of end organ damage noted on physical exam. Encouraged to monitor blood pressure at home and follow low-sodium diet.

## 2017-07-23 NOTE — Progress Notes (Signed)
Subjective:    Patient ID: Cynthia Ware, female    DOB: 30-Apr-1966, 51 y.o.   MRN: [id number]  Chief Complaint  Patient presents with  . CPE    Fasting, referral for mammogram, refills on bp meds 90 day supply preferred    HPI:  Cynthia Ware is a 51 y.o. female who presents today for an annual wellness visit.   1) Health Maintenance -   Diet - Averages about 3 meals per day and 1 snack consisting of a regular diet; No caffeine.   Exercise - Through work in housekeeping; no structured exercise   2) Preventative Exams / Immunizations:  Dental -- Due for exam  Vision -- Up to date   Health Maintenance  Topic Date Due  . MAMMOGRAM  06/19/2017  . INFLUENZA VACCINE  07/28/2017  . PAP SMEAR  08/22/2018  . TETANUS/TDAP  05/04/2021  . COLONOSCOPY  12/01/2025  . Hepatitis C Screening  Completed  . HIV Screening  Completed     There is no immunization history on file for this patient.    3.) Lesion on breast - This is a new problem. Associated symptom of a lesion located on the underside of her right breast has been going on for a couple of weeks and described initially as a cyst/boil. Now appears to have ruptured. Modifying factors include cleansing with soap and water and covering with triple antibiotic. No fevers. Course of the symptoms have remained stable. Currently covered by a bandage.  4.) Hypertension - Currently maintained on amlodipine and reports taking the medications as prescribed and notes new onset lower extremity edema with no other adverse side effects of hypotensive reading. No currently checking on blood pressure at home.  Denies changes in vision, worst headache of life or new symptoms of end organ damage. Working on following a low sodium diet.   BP Readings from Last 3 Encounters:  07/23/17 (!) 142/88  12/17/16 128/80  11/13/16 143/91    Allergies  Allergen Reactions  . Lisinopril-Hydrochlorothiazide Swelling     Outpatient Medications  Prior to Visit  Medication Sig Dispense Refill  . meloxicam (MOBIC) 15 MG tablet Take 1 tablet (15 mg total) by mouth daily. 30 tablet 0  . Multiple Vitamin (MULTIVITAMIN) tablet Take 1 tablet by mouth daily.    . varenicline (CHANTIX) 0.5 MG tablet Take 1 tablet (0.5 mg total) by mouth 2 (two) times daily. 60 tablet 0  . albuterol (PROVENTIL HFA;VENTOLIN HFA) 108 (90 Base) MCG/ACT inhaler Inhale 2 puffs into the lungs every 6 (six) hours as needed for wheezing or shortness of breath. 1 Inhaler 0  . amLODipine (NORVASC) 10 MG tablet Take 1 tablet (10 mg total) by mouth daily. Overdue for annual appt w/labs must see MD for refills 30 tablet 0  . cyclobenzaprine (FLEXERIL) 10 MG tablet 1/2 to 1 tablet every 8 hours as needed for muscle spasms 20 tablet 0  . fluticasone (FLONASE) 50 MCG/ACT nasal spray Place 2 sprays into both nostrils daily. 16 g 0  . naproxen (NAPROSYN) 500 MG tablet Take 1 tablet (500 mg total) by mouth 2 (two) times daily with a meal. 30 tablet 0  . amLODipine (NORVASC) 10 MG tablet Take 1 tablet (10 mg total) by mouth daily. Overdue for annual appt w/labs must see MD for refills 30 tablet 0  . azithromycin (ZITHROMAX) 250 MG tablet Take 2 tablets by mouth for 1 day and then 1 tablet by mouth for 4 days. 6  tablet 0  . HYDROcodone-homatropine (HYCODAN) 5-1.5 MG/5ML syrup Take 5 mLs by mouth at bedtime as needed for cough. 120 mL 0   No facility-administered medications prior to visit.      Past Medical History:  Diagnosis Date  . Arthritis    Foot  . Depression   . Hypertension      Past Surgical History:  Procedure Laterality Date  . TUBAL LIGATION  2003     Family History  Problem Relation Age of Onset  . Hypertension Mother   . Cancer Father        prostate  . Hypertension Father   . Hypertension Sister   . Colon cancer Neg Hx   . Esophageal cancer Neg Hx   . Rectal cancer Neg Hx   . Stomach cancer Neg Hx      Social History   Social History  .  Marital status: Single    Spouse name: N/A  . Number of children: 1  . Years of education: 77   Occupational History  . Housekeeping    Social History Main Topics  . Smoking status: Current Every Day Smoker    Packs/day: 0.50    Years: 34.00    Types: Cigarettes  . Smokeless tobacco: Never Used  . Alcohol use 0.0 oz/week     Comment: socially  . Drug use: No  . Sexual activity: Yes    Birth control/ protection: Surgical     Comment: INTERCOURSE AGE 70, SEXUAL PARTNERS MORE THAN 5   Other Topics Concern  . Not on file   Social History Narrative   Fun: Adventurous outdoor activities, TV (games shows, [place]), sports   Denies any religious beliefs effecting health care.       Review of Systems  Constitutional: Denies fever, chills, fatigue, or significant weight gain/loss. HENT: Head: Denies headache or neck pain Ears: Denies changes in hearing, ringing in ears, earache, drainage Nose: Denies discharge, stuffiness, itching, nosebleed, sinus pain Throat: Denies sore throat, hoarseness, dry mouth, sores, thrush Eyes: Denies loss/changes in vision, pain, redness, blurry/double vision, flashing lights Cardiovascular: Denies chest pain/discomfort, tightness, palpitations, shortness of breath with activity, difficulty lying down, swelling, sudden awakening with shortness of breath Respiratory: Denies shortness of breath, cough, sputum production, wheezing Gastrointestinal: Denies dysphasia, heartburn, change in appetite, nausea, change in bowel habits, rectal bleeding, constipation, diarrhea, yellow skin or eyes Genitourinary: Denies frequency, urgency, burning/pain, blood in urine, incontinence, change in urinary strength. Musculoskeletal: Denies muscle/joint pain, stiffness, back pain, redness or swelling of joints, trauma Skin: Denies rashes, lumps, itching, dryness, color changes, or hair/nail changes Neurological: Denies dizziness, fainting, seizures, weakness, numbness,  tingling, tremor Psychiatric - Denies nervousness, stress, depression or memory loss Endocrine: Denies heat or cold intolerance, sweating, frequent urination, excessive thirst, changes in appetite Hematologic: Denies ease of bruising or bleeding     Objective:     BP (!) 142/88 (BP Location: Left Arm, Patient Position: Sitting, Cuff Size: Normal)   Pulse 63   Temp 98.3 F (36.8 C) (Oral)   Resp 16   Ht 5' 5.5" (1.664 m)   Wt 169 lb (76.7 kg)   LMP 03/09/2012   SpO2 97%   BMI 27.70 kg/m  Nursing note and vital signs reviewed.  Physical Exam  Constitutional: She is oriented to person, place, and time. She appears well-developed and well-nourished.  HENT:  Head: Normocephalic.  Right Ear: Hearing, tympanic membrane, external ear and ear canal normal.  Left Ear: Hearing, tympanic membrane, external  ear and ear canal normal.  Nose: Nose normal.  Mouth/Throat: Uvula is midline, oropharynx is clear and moist and mucous membranes are normal.  Eyes: Pupils are equal, round, and reactive to light. Conjunctivae and EOM are normal.  Neck: Neck supple. No JVD present. No tracheal deviation present. No thyromegaly present.  Cardiovascular: Normal rate, regular rhythm, normal heart sounds and intact distal pulses.   Pulmonary/Chest: Effort normal and breath sounds normal.  Abdominal: Soft. Bowel sounds are normal. She exhibits no distension and no mass. There is no tenderness. There is no rebound and no guarding.  Musculoskeletal: Normal range of motion. She exhibits no edema or tenderness.  Lymphadenopathy:    She has no cervical adenopathy.  Neurological: She is alert and oriented to person, place, and time. She has normal reflexes. No cranial nerve deficit. She exhibits normal muscle tone. Coordination normal.  Skin: Skin is warm and dry.  Skin lesion located under right breast appears oval-shaped with reddened and flat area. Mildly tender to the touch and appears raw.  Psychiatric: She  has a normal mood and affect. Her behavior is normal. Judgment and thought content normal.       Assessment & Plan:   Problem List Items Addressed This Visit      Cardiovascular and Mediastinum   Essential hypertension    Blood pressure was marginal controlled current medication regimen and the adverse side effect of lower extremity edema. Discontinue amlodipine. Start furosemide as needed for edema. Start metoprolol for blood pressure control. Denies worst headache of life with any symptoms of end organ damage noted on physical exam. Encouraged to monitor blood pressure at home and follow low-sodium diet.      Relevant Medications   metoprolol succinate (TOPROL-XL) 25 MG 24 hr tablet   furosemide (LASIX) 20 MG tablet     Other   Encounter for general adult medical examination with abnormal findings - Primary    1) Anticipatory Guidance: Discussed importance of wearing a seatbelt while driving and not texting while driving; changing batteries in smoke detector at least once annually; wearing suntan lotion when outside; eating a balanced and moderate diet; getting physical activity at least 30 minutes per day.  2) Immunizations / Screenings / Labs:  All immunizations are up-to-date per recommendations. Due for breast cancer screening with order for mammogram placed. Cervical cancer and colon cancer screenings are up-to-date per recommendations. Due for dental exam encouraged to be completed independently. Hepatitis C screening has been completed. Obtain CBC, CMET, and lipid profile.    Overall well exam with risk factors for cardiovascular disease including obesity, tobacco use, and hypertension. Recommend weight loss of 5-10% of current body weight through nutrition and physical activity changes. Increase physical activity to 30 minutes of moderate level activity most days of the week or approximately 10,000 steps per day. Encouraged tobacco cessation. Continue other healthy lifestyle  behaviors and choices. Follow-up prevention exam in 1 year. Follow-up office visit pending blood work and for chronic conditions.      Relevant Orders   CBC (Completed)   Comprehensive metabolic panel (Completed)   Lipid panel (Completed)   Lesion of breast    New-onset lesion located on the underside of her right breast appears consistent with ruptured cyst/boil. Start Bactrim. Cleansed with soap and water with banadage applied. Follow-up if symptoms worsen or do not improve.       Other Visit Diagnoses    Encounter for screening mammogram for breast cancer  Relevant Orders   MM DIGITAL SCREENING BILATERAL       I have discontinued Ms. Hermida's fluticasone, cyclobenzaprine, naproxen, azithromycin, HYDROcodone-homatropine, albuterol, amLODipine, and amLODipine. I am also having her start on metoprolol succinate, furosemide, cyclobenzaprine, and sulfamethoxazole-trimethoprim. Additionally, I am having her maintain her multivitamin, meloxicam, and varenicline.   Meds ordered this encounter  Medications  . metoprolol succinate (TOPROL-XL) 25 MG 24 hr tablet    Sig: Take 1 tablet (25 mg total) by mouth daily.    Dispense:  30 tablet    Refill:  1    Order Specific Question:   Supervising Provider    Answer:   Pricilla Holm A [[id number]]  . furosemide (LASIX) 20 MG tablet    Sig: Take 1 tablet (20 mg total) by mouth daily as needed for fluid or edema.    Dispense:  30 tablet    Refill:  3    Order Specific Question:   Supervising Provider    Answer:   Pricilla Holm A [[id number]]  . cyclobenzaprine (FLEXERIL) 10 MG tablet    Sig: Take 1 tablet (10 mg total) by mouth 3 (three) times daily as needed for muscle spasms.    Dispense:  30 tablet    Refill:  0    Order Specific Question:   Supervising Provider    Answer:   Pricilla Holm A [[id number]]  . sulfamethoxazole-trimethoprim (BACTRIM DS,SEPTRA DS) 800-160 MG tablet    Sig: Take 1 tablet by mouth 2 (two) times daily.     Dispense:  20 tablet    Refill:  0    Order Specific Question:   Supervising Provider    Answer:   Pricilla Holm A [[id number]]     Follow-up: Return in about 1 month (around 08/23/2017), or if symptoms worsen or fail to improve.   Mauricio Po, FNP

## 2017-07-23 NOTE — Assessment & Plan Note (Addendum)
New-onset lesion located on the underside of her right breast appears consistent with ruptured cyst/boil. Start Bactrim. Cleansed with soap and water with banadage applied. Follow-up if symptoms worsen or do not improve.

## 2017-07-26 ENCOUNTER — Other Ambulatory Visit: Payer: Self-pay | Admitting: Family

## 2017-07-26 DIAGNOSIS — Z1231 Encounter for screening mammogram for malignant neoplasm of breast: Secondary | ICD-10-CM

## 2017-08-06 ENCOUNTER — Ambulatory Visit
Admission: RE | Admit: 2017-08-06 | Discharge: 2017-08-06 | Disposition: A | Discharge status: Home / Self Care | Payer: BLUE CROSS/BLUE SHIELD | Source: Ambulatory Visit | Attending: Family | Admitting: Family

## 2017-08-06 DIAGNOSIS — Z1231 Encounter for screening mammogram for malignant neoplasm of breast: Secondary | ICD-10-CM

## 2017-08-27 ENCOUNTER — Telehealth: Payer: Self-pay | Admitting: Family

## 2017-08-27 MED ORDER — AMLODIPINE BESYLATE 10 MG PO TABS: 10.0000 mg | ORAL_TABLET | Freq: Every day | ORAL | 0 refills | Status: DC

## 2017-08-27 NOTE — Telephone Encounter (Signed)
Amlodipine sent to pharmacy. Discontinue metoprolol.

## 2017-08-27 NOTE — Telephone Encounter (Signed)
Notified pt w/MD response.../lmb 

## 2017-08-27 NOTE — Telephone Encounter (Signed)
Patient states her bp med was just changed.  She has started to have dizzy spells.  She believes this is due to her new BP med.  She would like her old BP med called in.  Patient states she does not notice a difference as far a fluid with the new med.  Patient states it is time for a refill.  She has not taken medication today.  She wanted to see of Marya Amsler would send old script to pharmacy today for her.

## 2017-10-18 ENCOUNTER — Ambulatory Visit: Payer: BLUE CROSS/BLUE SHIELD | Admitting: Nurse Practitioner

## 2017-11-01 ENCOUNTER — Encounter: Payer: BLUE CROSS/BLUE SHIELD | Admitting: Women's Health

## 2017-11-25 ENCOUNTER — Telehealth: Payer: Self-pay | Admitting: Family

## 2017-11-25 NOTE — Telephone Encounter (Signed)
Copied from [place] [id number]. Topic: Quick Communication - Rx Refill/Question >> Nov 25, 2017  4:19 PM Patrice Paradise wrote: Has the patient contacted their pharmacy? yes - amLODipine (NORVASC) 10 MG tablet  (Agent: If no, request that the patient contact the pharmacy for the refill.)  Preferred Pharmacy (with phone number or street name):  [facility] [address] [place], [place] [address] Phone: [phone] Fax: [phone]   Agent: Please be advised that RX refills may take up to 48 hours. We ask that you follow-up with your pharmacy.

## 2017-11-26 ENCOUNTER — Other Ambulatory Visit: Payer: Self-pay

## 2017-11-26 MED ORDER — AMLODIPINE BESYLATE 10 MG PO TABS: 10.0000 mg | ORAL_TABLET | Freq: Every day | ORAL | 1 refills | Status: DC

## 2017-11-26 NOTE — Telephone Encounter (Signed)
erx sent of amlodipine for 30 day and 1 refill. No refills until patient is seen.

## 2017-11-26 NOTE — Telephone Encounter (Signed)
Appt resch with Ashleigh  Please advise on refill

## 2017-11-26 NOTE — Telephone Encounter (Signed)
Not sure about this refill since Calone has left and she has not established with another provider.Thanks.

## 2018-01-24 IMAGING — DX DG CHEST 2V
2 series · 2 of 2 positions shown · non-contrast
Comparison: None in PACs

CLINICAL DATA: Two weeks of productive cough, 3 days of fever,
history of tobacco use, hypertension

EXAM:
CHEST  2 VIEW

[chest pa]
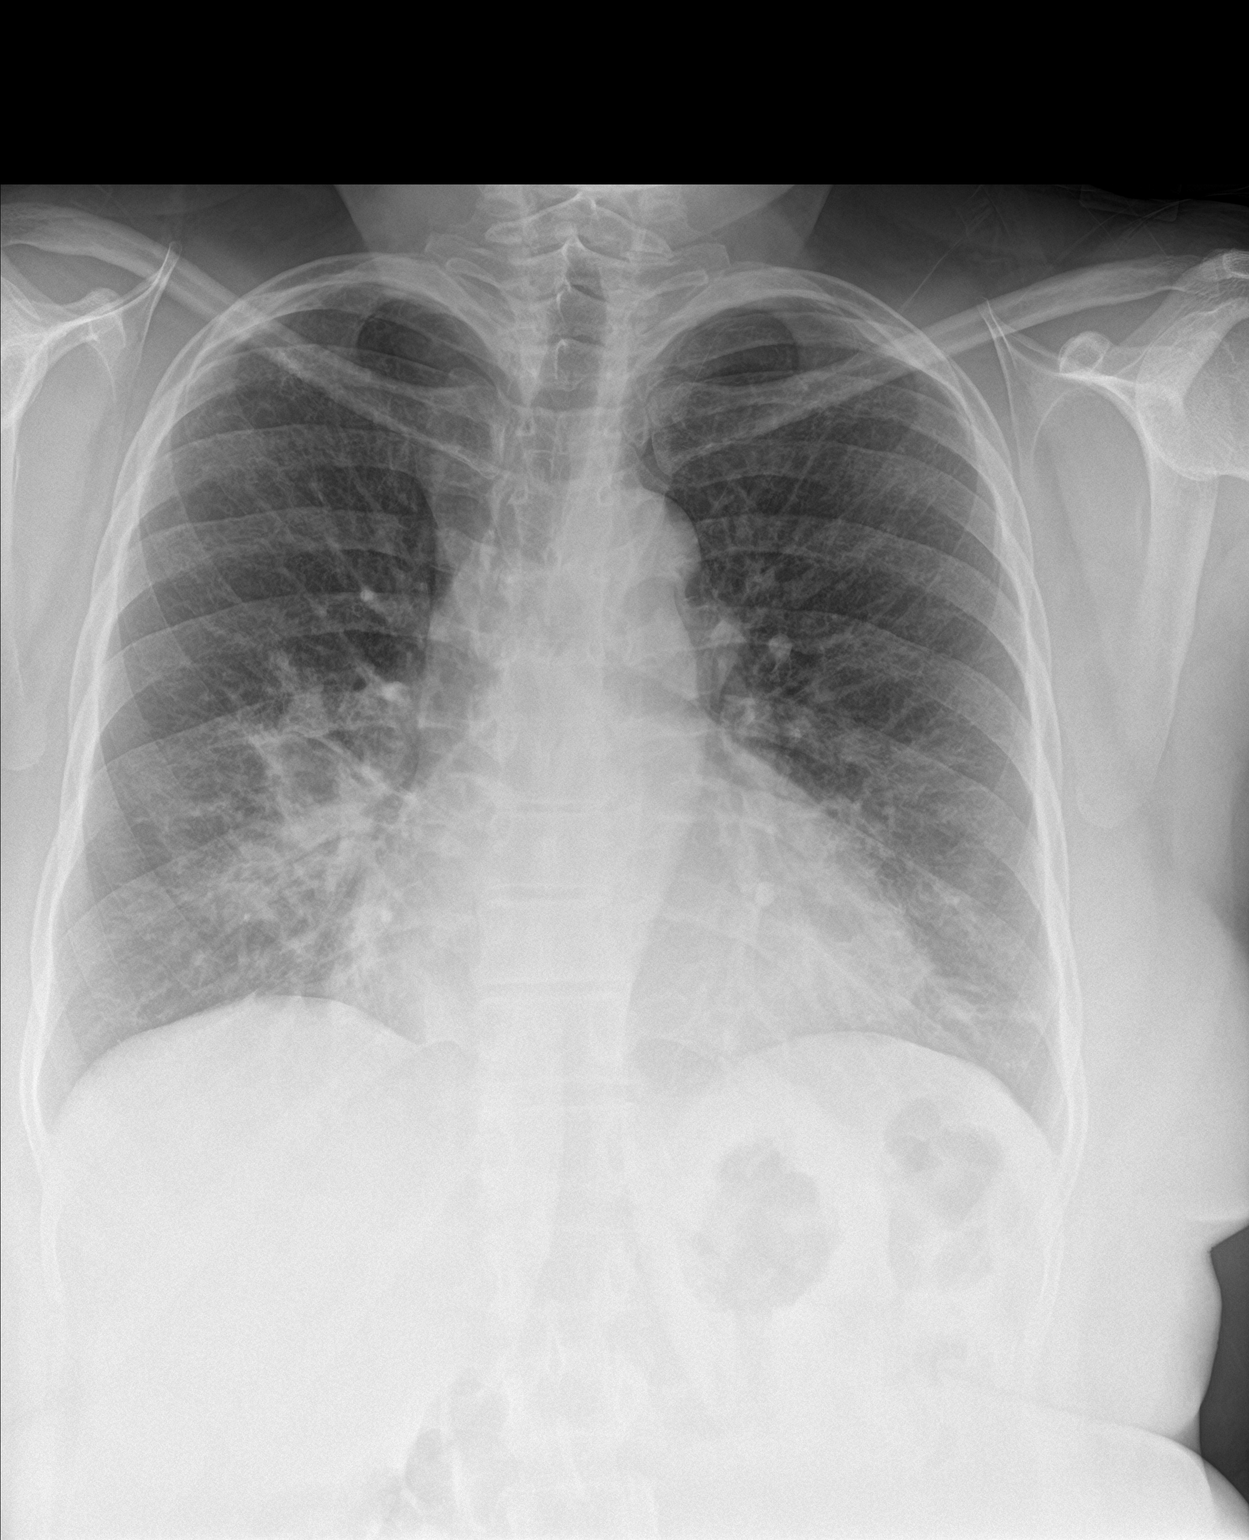

[chest lat]
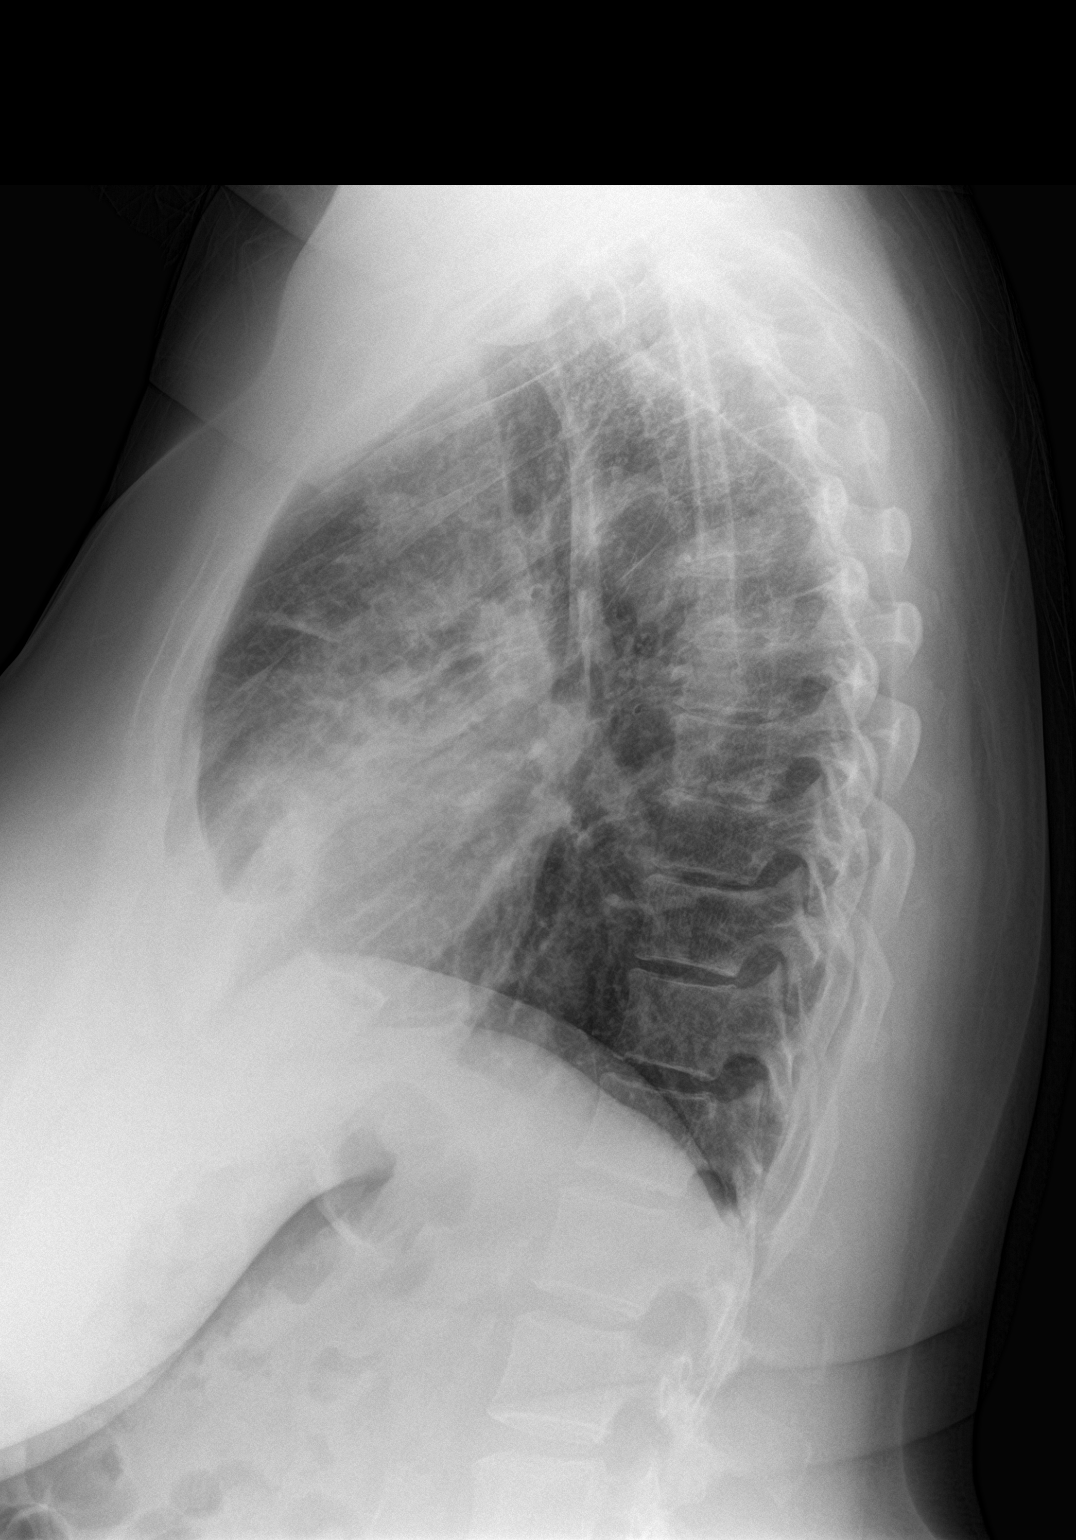

[2 of 2 positions shown; findings below may reference images not displayed]

FINDINGS: The lungs are adequately inflated. Confluent right perihilar density
anteriorly is present. The interstitial markings elsewhere are
coarse. There is no pleural effusion. The cardiac silhouette is
enlarged. The pulmonary vascularity is not clearly engorged. The
bony thorax exhibits no acute abnormality.
IMPRESSION: Right middle lobe and inferior right upper lobe pneumonia.
Underlying chronic bronchitic-smoking related changes. Followup PA
and lateral chest X-ray is recommended in 3-4 weeks following trial
of antibiotic therapy to ensure resolution and exclude underlying
malignancy.

## 2018-01-28 ENCOUNTER — Encounter: Payer: Self-pay | Admitting: Nurse Practitioner

## 2018-01-28 ENCOUNTER — Ambulatory Visit: Payer: BLUE CROSS/BLUE SHIELD | Admitting: Nurse Practitioner

## 2018-01-28 VITALS — BP 138/78 | HR 80 | Temp 98.4°F | Resp 16 | Ht 65.5 in | Wt 173.0 lb

## 2018-01-28 DIAGNOSIS — R200 Anesthesia of skin: Secondary | ICD-10-CM | POA: Diagnosis not present

## 2018-01-28 DIAGNOSIS — I10 Essential (primary) hypertension: Secondary | ICD-10-CM

## 2018-01-28 DIAGNOSIS — R0602 Shortness of breath: Secondary | ICD-10-CM | POA: Diagnosis not present

## 2018-01-28 MED ORDER — HYDROCHLOROTHIAZIDE 25 MG PO TABS: 25.0000 mg | ORAL_TABLET | Freq: Every day | ORAL | 1 refills | Status: DC

## 2018-01-28 MED ORDER — AMLODIPINE BESYLATE 10 MG PO TABS: 10.0000 mg | ORAL_TABLET | Freq: Every day | ORAL | 2 refills | Status: DC

## 2018-01-28 NOTE — Assessment & Plan Note (Signed)
Continue amlodipine, add HCTZ She says has taken HCTZ in past with no adverse reactions Will obtain EKG today for dizziness and SOB- suspect could be related to elevated BP We discussed BP monitoring at home and healthy diet and exercise in the reduction of blood pressure and improvement in overall health-See AVS for education provided to patient - EKG 12-Lead reviewed today note sinus rhythm without acute abnormalities - hydrochlorothiazide (HYDRODIURIL) 25 MG tablet; Take 1 tablet (25 mg total) by mouth daily.  Dispense: 30 tablet; Refill: 1 - amLODipine (NORVASC) 10 MG tablet; Take 1 tablet (10 mg total) by mouth daily.  Dispense: 90 tablet; Refill: 2 RTC in 2-3 weeks for follow up

## 2018-01-28 NOTE — Patient Instructions (Addendum)
Please start HCTZ 25mg  once daily for your blood pressure. Please continue to take your amlodipine 10 mg daily. Please try to check your blood pressure once daily or at least a few times a week, at the same time each day, and keep a log. Please return in about 2-3 weeks with your log and blood pressure cuff and we will decided if we need to adjust your blood pressure medications again.  Pease schedule a follow up with one of our sports medicine providers for your arm pain.  It was nice to meet you. Thanks for letting me take care of you today :)   DASH Eating Plan DASH stands for "Dietary Approaches to Stop Hypertension." The DASH eating plan is a healthy eating plan that has been shown to reduce high blood pressure (hypertension). It may also reduce your risk for type 2 diabetes, heart disease, and stroke. The DASH eating plan may also help with weight loss. What are tips for following this plan? General guidelines  Avoid eating more than 2,300 mg (milligrams) of salt (sodium) a day. If you have hypertension, you may need to reduce your sodium intake to 1,500 mg a day.  Limit alcohol intake to no more than 1 drink a day for nonpregnant women and 2 drinks a day for men. One drink equals 12 oz of beer, 5 oz of wine, or 1 oz of hard liquor.  Work with your health care provider to maintain a healthy body weight or to lose weight. Ask what an ideal weight is for you.  Get at least 30 minutes of exercise that causes your heart to beat faster (aerobic exercise) most days of the week. Activities may include walking, swimming, or biking.  Work with your health care provider or diet and nutrition specialist (dietitian) to adjust your eating plan to your individual calorie needs. Reading food labels  Check food labels for the amount of sodium per serving. Choose foods with less than 5 percent of the Daily Value of sodium. Generally, foods with less than 300 mg of sodium per serving fit into this  eating plan.  To find whole grains, look for the word "whole" as the first word in the ingredient list. Shopping  Buy products labeled as "low-sodium" or "no salt added."  Buy fresh foods. Avoid canned foods and premade or frozen meals. Cooking  Avoid adding salt when cooking. Use salt-free seasonings or herbs instead of table salt or sea salt. Check with your health care provider or pharmacist before using salt substitutes.  Do not fry foods. Cook foods using healthy methods such as baking, boiling, grilling, and broiling instead.  Cook with heart-healthy oils, such as olive, canola, soybean, or sunflower oil. Meal planning   Eat a balanced diet that includes: ? 5 or more servings of fruits and vegetables each day. At each meal, try to fill half of your plate with fruits and vegetables. ? Up to 6-8 servings of whole grains each day. ? Less than 6 oz of lean meat, poultry, or fish each day. A 3-oz serving of meat is about the same size as a deck of cards. One egg equals 1 oz. ? 2 servings of low-fat dairy each day. ? A serving of nuts, seeds, or beans 5 times each week. ? Heart-healthy fats. Healthy fats called Omega-3 fatty acids are found in foods such as flaxseeds and coldwater fish, like sardines, salmon, and mackerel.  Limit how much you eat of the following: ? Canned or prepackaged  foods. ? Food that is high in trans fat, such as fried foods. ? Food that is high in saturated fat, such as fatty meat. ? Sweets, desserts, sugary drinks, and other foods with added sugar. ? Full-fat dairy products.  Do not salt foods before eating.  Try to eat at least 2 vegetarian meals each week.  Eat more home-cooked food and less restaurant, buffet, and fast food.  When eating at a restaurant, ask that your food be prepared with less salt or no salt, if possible. What foods are recommended? The items listed may not be a complete list. Talk with your dietitian about what dietary choices  are best for you. Grains Whole-grain or whole-wheat bread. Whole-grain or whole-wheat pasta. Brown rice. [place]. Bulgur. Whole-grain and low-sodium cereals. Pita bread. Low-fat, low-sodium crackers. Whole-wheat flour tortillas. Vegetables Fresh or frozen vegetables (raw, steamed, roasted, or grilled). Low-sodium or reduced-sodium tomato and vegetable juice. Low-sodium or reduced-sodium tomato sauce and tomato paste. Low-sodium or reduced-sodium canned vegetables. Fruits All fresh, dried, or frozen fruit. Canned fruit in natural juice (without added sugar). Meat and other protein foods Skinless chicken or [place]. Ground chicken or [place]. Pork with fat trimmed off. Fish and seafood. Egg whites. Dried beans, peas, or lentils. Unsalted nuts, nut butters, and seeds. Unsalted canned beans. Lean cuts of beef with fat trimmed off. Low-sodium, lean deli meat. Dairy Low-fat (1%) or fat-free (skim) milk. Fat-free, low-fat, or reduced-fat cheeses. Nonfat, low-sodium ricotta or cottage cheese. Low-fat or nonfat yogurt. Low-fat, low-sodium cheese. Fats and oils Soft margarine without trans fats. Vegetable oil. Low-fat, reduced-fat, or light mayonnaise and salad dressings (reduced-sodium). Canola, safflower, olive, soybean, and sunflower oils. Avocado. Seasoning and other foods Herbs. Spices. Seasoning mixes without salt. Unsalted popcorn and pretzels. Fat-free sweets. What foods are not recommended? The items listed may not be a complete list. Talk with your dietitian about what dietary choices are best for you. Grains Baked goods made with fat, such as croissants, muffins, or some breads. Dry pasta or rice meal packs. Vegetables Creamed or fried vegetables. Vegetables in a cheese sauce. Regular canned vegetables (not low-sodium or reduced-sodium). Regular canned tomato sauce and paste (not low-sodium or reduced-sodium). Regular tomato and vegetable juice (not low-sodium or reduced-sodium). Angie Fava.  Olives. Fruits Canned fruit in a light or heavy syrup. Fried fruit. Fruit in cream or butter sauce. Meat and other protein foods Fatty cuts of meat. Ribs. Fried meat. Berniece Salines. Sausage. Bologna and other processed lunch meats. Salami. Fatback. Hotdogs. Bratwurst. Salted nuts and seeds. Canned beans with added salt. Canned or smoked fish. Whole eggs or egg yolks. Chicken or [place] with skin. Dairy Whole or 2% milk, cream, and half-and-half. Whole or full-fat cream cheese. Whole-fat or sweetened yogurt. Full-fat cheese. Nondairy creamers. Whipped toppings. Processed cheese and cheese spreads. Fats and oils Butter. Stick margarine. Lard. Shortening. Ghee. Bacon fat. Tropical oils, such as coconut, palm kernel, or palm oil. Seasoning and other foods Salted popcorn and pretzels. Onion salt, garlic salt, seasoned salt, table salt, and sea salt. Worcestershire sauce. Tartar sauce. Barbecue sauce. Teriyaki sauce. Soy sauce, including reduced-sodium. Steak sauce. Canned and packaged gravies. Fish sauce. Oyster sauce. Cocktail sauce. Horseradish that you find on the shelf. Ketchup. Mustard. Meat flavorings and tenderizers. Bouillon cubes. Hot sauce and Tabasco sauce. Premade or packaged marinades. Premade or packaged taco seasonings. Relishes. Regular salad dressings. Where to find more information:  National Heart, Lung, and [place]: https://wilson-eaton.com/  American Heart Association: www.heart.org Summary  The DASH eating  plan is a healthy eating plan that has been shown to reduce high blood pressure (hypertension). It may also reduce your risk for type 2 diabetes, heart disease, and stroke.  With the DASH eating plan, you should limit salt (sodium) intake to 2,300 mg a day. If you have hypertension, you may need to reduce your sodium intake to 1,500 mg a day.  When on the DASH eating plan, aim to eat more fresh fruits and vegetables, whole grains, lean proteins, low-fat dairy, and heart-healthy  fats.  Work with your health care provider or diet and nutrition specialist (dietitian) to adjust your eating plan to your individual calorie needs. This information is not intended to replace advice given to you by your health care provider. Make sure you discuss any questions you have with your health care provider. Document Released: 12/03/2011 Document Revised: 12/07/2016 Document Reviewed: 12/07/2016 Elsevier Interactive Patient Education  [employer].

## 2018-01-28 NOTE — Progress Notes (Signed)
Name: Cynthia Ware   MRN: [id number]    DOB: 1966/03/20   Date:01/28/2018       Progress Note  Subjective  Chief Complaint  Chief Complaint  Patient presents with  . Establish Care    tingling in left hand when she lays on that side     HPI Cynthia Ware is establishing care today. She is c/o left arm numbness and she says she stopped taking her metoprolol.  Arm numbness- She c/o left arm tingling and numbness This began a few months ago She only feels the tingling and numbness when she wakes from sleep. She feels like her arm and hand are "going to sleep" She does not notice the symptoms during the daytime when she is awake. She denies confusion, speech changes. She denies any injuries, discoloration, swelling to her arm. 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 She reports weakness in her hand.  Hypertension -maintained on amlodipine 10 daily She was also on metoprolol but stopped taking it. She thinks it made her feel bad but she can not remember why it made her feel bad. Reports she does not routinely check her blood pressure. Denies headaches, vision changes, chest pain, edema. She reports occasional shortness of breath and dizziness.  BP Readings from Last 3 Encounters:  01/28/18 138/78  07/23/17 (!) 142/88  12/17/16 128/80    Patient Active Problem List   Diagnosis Date Noted  . Encounter for general adult medical examination with abnormal findings 07/23/2017  . Lesion of breast 07/23/2017  . Essential hypertension 07/23/2017  . Acute bronchitis 12/17/2016  . Boil, face 05/15/2016  . Routine general medical examination at a health care facility 06/03/2015  . Pain and swelling of left lower leg 06/03/2015  . Fibroids 04/27/2012  . Abnormal uterine bleeding 04/27/2012    Past Surgical History:  Procedure Laterality Date  . TUBAL LIGATION  2003    Family History  Problem Relation Age of Onset  . Hypertension Mother   . Cancer  Father        prostate  . Hypertension Father   . Hypertension Sister   . Colon cancer Neg Hx   . Esophageal cancer Neg Hx   . Rectal cancer Neg Hx   . Stomach cancer Neg Hx     Social History   Socioeconomic History  . Marital status: Single    Spouse name: Not on file  . Number of children: 1  . Years of education: 5  . Highest education level: Not on file  Social Needs  . Financial resource strain: Not on file  . Food insecurity - worry: Not on file  . Food insecurity - inability: Not on file  . Transportation needs - medical: Not on file  . Transportation needs - non-medical: Not on file  Occupational History  . Occupation: Housekeeping  Tobacco Use  . Smoking status: Current Every Day Smoker    Packs/day: 0.50    Years: 34.00    Pack years: 17.00    Types: Cigarettes  . Smokeless tobacco: Never Used  Substance and Sexual Activity  . Alcohol use: Yes    Alcohol/week: 0.0 oz    Comment: socially  . Drug use: No  . Sexual activity: Yes    Birth control/protection: Surgical    Comment: INTERCOURSE AGE 68, SEXUAL PARTNERS MORE THAN 5  Other Topics Concern  . Not on file  Social History Narrative   Fun: Adventurous outdoor activities, TV (  games shows, Westerns), sports   Denies any religious beliefs effecting health care.      Current Outpatient Medications:  .  amLODipine (NORVASC) 10 MG tablet, Take 1 tablet (10 mg total) by mouth daily., Disp: 30 tablet, Rfl: 1 .  metoprolol succinate (TOPROL-XL) 25 MG 24 hr tablet, Take 1 tablet (25 mg total) by mouth daily. (Patient not taking: Reported on 01/28/2018), Disp: 30 tablet, Rfl: 1  Allergies  Allergen Reactions  . Lisinopril-Hydrochlorothiazide Swelling    ROS See HPI  Objective  Vitals:   01/28/18 1431  BP: 138/78  Pulse: 80  Resp: 16  Temp: 98.4 F (36.9 C)  TempSrc: Oral  SpO2: 96%  Weight: 173 lb (78.5 kg)  Height: 5' 5.5" (1.664 m)   Body mass index is 28.35 kg/m.  Physical  Exam Constitutional: Patient appears well-developed and well-nourished. No distress.  HENT: Head: Normocephalic and atraumatic. Nose: Nose normal. Mouth/Throat: Oropharynx is clear and moist. No oropharyngeal exudate.  Eyes: Conjunctivae and EOM are normal. Pupils are equal, round, and reactive to light. No scleral icterus.  Neck: Normal range of motion. Neck supple. Cardiovascular: Normal rate, regular rhythm and normal heart sounds. No BLE edema. Distal pulses intact. Pulmonary/Chest: Effort normal and breath sounds normal. No respiratory distress. Abdominal: Soft. Bowel sounds are normal, no distension. There is no tenderness. no masses Musculoskeletal: Normal range of motion, no joint effusions. No gross deformities Neurological: She is alert and oriented to person, place, and time. No cranial nerve deficit. Coordination, balance, strength, speech and gait are normal.  Skin: Skin is warm and dry. No rash noted. No erythema.  Psychiatric: Patient has a normal mood and affect. behavior is normal. Judgment and thought content normal.  Assessment & Plan RTC in 2-3 weeks for HCTZ, BP follow up  -Reviewed Health Maintenance: up to date  Arm numbness left Suspect related to overuse at work? I would like her to follow up with sports medicine providers here in our clinic for further evaluation She was given instructions to schedule appointment

## 2018-02-16 ENCOUNTER — Ambulatory Visit: Payer: BLUE CROSS/BLUE SHIELD | Admitting: Family Medicine

## 2018-03-01 ENCOUNTER — Ambulatory Visit: Payer: BLUE CROSS/BLUE SHIELD | Admitting: Nurse Practitioner

## 2018-03-01 ENCOUNTER — Encounter: Payer: Self-pay | Admitting: Nurse Practitioner

## 2018-03-01 ENCOUNTER — Other Ambulatory Visit (INDEPENDENT_AMBULATORY_CARE_PROVIDER_SITE_OTHER): Payer: BLUE CROSS/BLUE SHIELD

## 2018-03-01 DIAGNOSIS — I10 Essential (primary) hypertension: Secondary | ICD-10-CM

## 2018-03-01 LAB — BASIC METABOLIC PANEL
BUN: 13 mg/dL (ref 6–23)
CALCIUM: 9.5 mg/dL (ref 8.4–10.5)
CO2: 32 mEq/L (ref 19–32)
Chloride: 102 mEq/L (ref 96–112)
Creatinine, Ser: 0.84 mg/dL (ref 0.40–1.20)
GFR: 90.88 mL/min (ref >60.00)
GLUCOSE: 78 mg/dL (ref 70–99)
Potassium: 3.7 mEq/L (ref 3.5–5.1)
SODIUM: 139 meq/L (ref 135–145)

## 2018-03-01 MED ORDER — HYDROCHLOROTHIAZIDE 25 MG PO TABS
25.0000 mg | ORAL_TABLET | Freq: Every day | ORAL | 2 refills | Status: DC
Start: 2018-03-01 — End: 2019-03-10

## 2018-03-01 NOTE — Patient Instructions (Signed)
Please head downstairs for lab work.  Please continue your amlodipine 10 and HCTZ 25 daily. Please let me know if you have blood pressure readings below 110/70, above 140/90.  Well see you back after 7/27 for your annual physical, or sooner if you need me.   Mediterranean Diet A Mediterranean diet refers to food and lifestyle choices that are based on the traditions of countries located on the [employer]. This way of eating has been shown to help prevent certain conditions and improve outcomes for people who have chronic diseases, like kidney disease and heart disease. What are tips for following this plan? Lifestyle  Cook and eat meals together with your family, when possible.  Drink enough fluid to keep your urine clear or pale yellow.  Be physically active every day. This includes: ? Aerobic exercise like running or swimming. ? Leisure activities like gardening, walking, or housework.  Get 7-8 hours of sleep each night.  If recommended by your health care provider, drink red wine in moderation. This means 1 glass a day for nonpregnant women and 2 glasses a day for men. A glass of wine equals 5 oz (150 mL). Reading food labels  Check the serving size of packaged foods. For foods such as rice and pasta, the serving size refers to the amount of cooked product, not dry.  Check the total fat in packaged foods. Avoid foods that have saturated fat or trans fats.  Check the ingredients list for added sugars, such as corn syrup. Shopping  At the grocery store, buy most of your food from the areas near the walls of the store. This includes: ? Fresh fruits and vegetables (produce). ? Grains, beans, nuts, and seeds. Some of these may be available in unpackaged forms or large amounts (in bulk). ? Fresh seafood. ? Poultry and eggs. ? Low-fat dairy products.  Buy whole ingredients instead of prepackaged foods.  Buy fresh fruits and vegetables in-season from local farmers  markets.  Buy frozen fruits and vegetables in resealable bags.  If you do not have access to quality fresh seafood, buy precooked frozen shrimp or canned fish, such as tuna, salmon, or sardines.  Buy small amounts of raw or cooked vegetables, salads, or olives from the deli or salad bar at your store.  Stock your pantry so you always have certain foods on hand, such as olive oil, canned tuna, canned tomatoes, rice, pasta, and beans. Cooking  Cook foods with extra-virgin olive oil instead of using butter or other vegetable oils.  Have meat as a side dish, and have vegetables or grains as your main dish. This means having meat in small portions or adding small amounts of meat to foods like pasta or stew.  Use beans or vegetables instead of meat in common dishes like chili or lasagna.  Experiment with different cooking methods. Try roasting or broiling vegetables instead of steaming or sauteing them.  Add frozen vegetables to soups, stews, pasta, or rice.  Add nuts or seeds for added healthy fat at each meal. You can add these to yogurt, salads, or vegetable dishes.  Marinate fish or vegetables using olive oil, lemon juice, garlic, and fresh herbs. Meal planning  Plan to eat 1 vegetarian meal one day each week. Try to work up to 2 vegetarian meals, if possible.  Eat seafood 2 or more times a week.  Have healthy snacks readily available, such as: ? Vegetable sticks with hummus. ? [place] yogurt. ? Fruit and nut trail mix.  Eat balanced meals throughout the week. This includes: ? Fruit: 2-3 servings a day ? Vegetables: 4-5 servings a day ? Low-fat dairy: 2 servings a day ? Fish, poultry, or lean meat: 1 serving a day ? Beans and legumes: 2 or more servings a week ? Nuts and seeds: 1-2 servings a day ? Whole grains: 6-8 servings a day ? Extra-virgin olive oil: 3-4 servings a day  Limit red meat and sweets to only a few servings a month What are my food choices?  Mediterranean  diet ? Recommended ? Grains: Whole-grain pasta. Brown rice. Bulgar wheat. Polenta. Couscous. Whole-wheat bread. [place]. ? Vegetables: Artichokes. Beets. Broccoli. Cabbage. Carrots. Eggplant. Green beans. Chard. Kale. Spinach. Onions. Leeks. Peas. Squash. Tomatoes. Peppers. Radishes. ? Fruits: Apples. Apricots. Avocado. Berries. Bananas. Cherries. Dates. Figs. Grapes. Lemons. Melon. Oranges. Peaches. Plums. Pomegranate. ? Meats and other protein foods: Beans. Almonds. Sunflower seeds. Pine nuts. Peanuts. [place]. Salmon. Scallops. Shrimp. [place]. Tilapia. Clams. Oysters. Eggs. ? Dairy: Low-fat milk. Cheese. Greek yogurt. ? Beverages: Water. Red wine. Herbal tea. ? Fats and oils: Extra virgin olive oil. Avocado oil. Grape seed oil. ? Sweets and desserts: [place] yogurt with honey. Baked apples. Poached pears. Trail mix. ? Seasoning and other foods: Basil. Cilantro. Coriander. Cumin. Mint. Parsley. Sage. Rosemary. Tarragon. Garlic. Oregano. Thyme. Pepper. Balsalmic vinegar. Tahini. Hummus. Tomato sauce. Olives. Mushrooms. ? Limit these ? Grains: Prepackaged pasta or rice dishes. Prepackaged cereal with added sugar. ? Vegetables: Deep fried potatoes (french fries). ? Fruits: Fruit canned in syrup. ? Meats and other protein foods: Beef. Pork. Lamb. Poultry with skin. Hot dogs. Bernie[place] Salines. ? Dairy: Ice cream. Sour cream. Whole milk. ? Beverages: Juice. Sugar-sweetened soft drinks. Beer. Liquor and spirits. ? Fats and oils: Butter. Canola oil. Vegetable oil. Beef fat (tallow). Lard. ? Sweets and desserts: Cookies. Cakes. Pies. Candy. ? Seasoning and other foods: Mayonnaise. Premade sauces and marinades. ? The items listed may not be a complete list. Talk with your dietitian about what dietary choices are right for you. Summary  The Mediterranean diet includes both food and lifestyle choices.  Eat a variety of fresh fruits and vegetables, beans, nuts, seeds, and whole grains.  Limit the amount of red  meat and sweets that you eat.  Talk with your health care provider about whether it is safe for you to drink red wine in moderation. This means 1 glass a day for nonpregnant women and 2 glasses a day for men. A glass of wine equals 5 oz (150 mL). This information is not intended to replace advice given to you by your health care provider. Make sure you discuss any questions you have with your health care provider. Document Released: 08/06/2016 Document Revised: 09/08/2016 Document Reviewed: 08/06/2016 Elsevier Interactive Patient Education  [employer].

## 2018-03-01 NOTE — Progress Notes (Signed)
Name: Cynthia Ware   MRN: [id number]    DOB: [birth year]   Date:03/01/2018       Progress Note  Subjective  Chief Complaint  Chief Complaint  Patient presents with  . Follow-up    blood pressure    HPI  Hypertension -maintained on amlodipine 10 daily and HCTZ 25 was added at her last visit for mildly elevated blood pressure readings and complaints of feeling dizzy and short of breath She started the HCTZ as instructed and continued the amlodipine. She has been tolerating both medications well and actually feels better today, she has not noticed the dizziness or shortness of breath since on the new medication. She actually did run out of the HCTZ about 2 days ago but did not fill yet, she wanted to wait until her appointment today to make sure she should keep taking it She has continued to have some weakness and tingling in her left hand, which she was referred to sports medicine for at her last visit, but did not find the time to make follow up appointment yet-she says she is still planning to make this follow up She has not been checking her blood pressure at home but says she is going to buy a blood pressure cuff next week. Denies headaches, vision changes, chest pain, shortness of breath. Reports chronic mild bilateral ankle edema.  BP Readings from Last 3 Encounters:  03/01/18 140/82  01/28/18 138/78  07/23/17 (!) 142/88      Patient Active Problem List   Diagnosis Date Noted  . Encounter for general adult medical examination with abnormal findings 07/23/2017  . Lesion of breast 07/23/2017  . Essential hypertension 07/23/2017  . Acute bronchitis 12/17/2016  . Boil, face 05/15/2016  . Routine general medical examination at a health care facility 06/03/2015  . Pain and swelling of left lower leg 06/03/2015  . Fibroids 04/27/2012  . Abnormal uterine bleeding 04/27/2012    Past Surgical History:  Procedure Laterality Date  . TUBAL LIGATION  2003    Family History   Problem Relation Age of Onset  . Hypertension Mother   . Cancer Father        prostate  . Hypertension Father   . Hypertension Sister   . Colon cancer Neg Hx   . Esophageal cancer Neg Hx   . Rectal cancer Neg Hx   . Stomach cancer Neg Hx     Social History   Socioeconomic History  . Marital status: Single    Spouse name: Not on file  . Number of children: 1  . Years of education: 47  . Highest education level: Not on file  Social Needs  . Financial resource strain: Not on file  . Food insecurity - worry: Not on file  . Food insecurity - inability: Not on file  . Transportation needs - medical: Not on file  . Transportation needs - non-medical: Not on file  Occupational History  . Occupation: Housekeeping  Tobacco Use  . Smoking status: Current Every Day Smoker    Packs/day: 0.50    Years: 34.00    Pack years: 17.00    Types: Cigarettes  . Smokeless tobacco: Never Used  Substance and Sexual Activity  . Alcohol use: Yes    Alcohol/week: 0.0 oz    Comment: socially  . Drug use: No  . Sexual activity: Yes    Birth control/protection: Surgical    Comment: INTERCOURSE AGE 6, SEXUAL PARTNERS MORE THAN 5  Other Topics Concern  .  Not on file  Social History Narrative   Fun: Adventurous outdoor activities, TV (games shows, [place]), sports   Denies any religious beliefs effecting health care.      Current Outpatient Medications:  .  amLODipine (NORVASC) 10 MG tablet, Take 1 tablet (10 mg total) by mouth daily., Disp: 90 tablet, Rfl: 2 .  hydrochlorothiazide (HYDRODIURIL) 25 MG tablet, Take 1 tablet (25 mg total) by mouth daily., Disp: 30 tablet, Rfl: 1  Allergies  Allergen Reactions  . Ace Inhibitors Swelling     ROS See HPI  Objective  Vitals:   03/01/18 0920  BP: 140/82  Pulse: 76  Resp: 16  Temp: 98 F (36.7 C)  TempSrc: Oral  SpO2: 97%  Weight: 172 lb 1.9 oz (78.1 kg)  Height: 5' 5.5" (1.664 m)    Body mass index is 28.21 kg/m.  Physical  Exam Vital signs reviewed. Constitutional: Patient appears well-developed and well-nourished. No distress.  HENT: Head: Normocephalic and atraumatic. Nose: Nose normal. Mouth/Throat: Oropharynx is clear and moist. Eyes: Conjunctivae and EOM are normal. No scleral icterus.  Neck: Normal range of motion. Neck supple. Cardiovascular: Normal rate, regular rhythm and normal heart sounds. Mild nonpitting edema to bilateral ankles. Distal pulses intact. Pulmonary/Chest: Effort normal and breath sounds normal. No respiratory distress. Abdominal: Soft. Bowel sounds are normal, no distension. Musculoskeletal: Normal range of motion, no joint effusions. No gross deformities Neurological: She is alert and oriented to person, place, and time. Coordination, balance, strength, speech and gait are normal.  Skin: Skin is warm and dry. No rash noted. No erythema.  Psychiatric: Patient has a normal mood and affect. behavior is normal. Judgment and thought content normal.  Assessment & Plan RTC in about 4 months for CPE, BP follow up  -Reviewed Health Maintenance: up to date  1. Essential hypertension BP at goal today, we discussed continuing HCTZ and amlodipine and she agrees She plans to monitor BP at home and we talked about indications and parameters for follow up. We also Discussed the role of healthy diet and exercise in the management of HTN-See AVS for education provided to patient - Basic metabolic panel; Future - hydrochlorothiazide (HYDRODIURIL) 25 MG tablet; Take 1 tablet (25 mg total) by mouth daily.  Dispense: 90 tablet; Refill: 2 RTC in about 4 months for CPE

## 2018-03-28 ENCOUNTER — Ambulatory Visit: Payer: BLUE CROSS/BLUE SHIELD | Admitting: Family Medicine

## 2018-04-12 ENCOUNTER — Ambulatory Visit: Payer: BLUE CROSS/BLUE SHIELD | Admitting: Family Medicine

## 2018-04-19 ENCOUNTER — Ambulatory Visit: Payer: BLUE CROSS/BLUE SHIELD | Admitting: Family Medicine

## 2018-04-26 ENCOUNTER — Telehealth: Payer: Self-pay | Admitting: Nurse Practitioner

## 2018-04-26 DIAGNOSIS — I10 Essential (primary) hypertension: Secondary | ICD-10-CM

## 2018-04-26 MED ORDER — AMLODIPINE BESYLATE 10 MG PO TABS: 10.0000 mg | ORAL_TABLET | Freq: Every day | ORAL | 1 refills | Status: DC

## 2018-04-26 NOTE — Telephone Encounter (Signed)
Requesting Amlodipine refill be sent to [facility] on [address]. (reill on 01/28/18, #90, RF x 2 was lost when changed from [facility] to [facility])  Last office visit 03/01/18 PCP: Brien Few  Will send updated Rx on Amlodipine to [facility], as pt. up to date with appt. F/u.

## 2018-04-26 NOTE — Telephone Encounter (Signed)
Copied from [place] [phone]. Topic: Quick Communication - Rx Refill/Question >> Apr 26, 2018  7:40 AM Scherrie Gerlach wrote: Medication: amLODipine (NORVASC) 10 MG tablet Pt called [facility], but this was filled by [facility] (02/09/190 last time and they advised pt they do not have this rx. (pharmacy changed names) Pt requesting the remaining refills be resent to [facility] [place], [place] - [address] [phone] (Phone) [phone] (Fax)

## 2018-05-05 NOTE — Telephone Encounter (Signed)
error 

## 2018-05-26 NOTE — Progress Notes (Signed)
[facility] [employer] [place], [place] [address] Phone: [phone] Subjective:     CC: right ankle pain  XTK:[id number]  Cynthia Ware is a 52 y.o. female coming in with complaint of right hand and right ankle pain.   Ankle. States her foot swells.   Onset- Chronic(2-3 years)  Location- Medial Duration- daily Character- Throbbing Aggravating factors- standing Reliving factors-  Therapies tried- seen another rprovider and was to get orthotics but never did  Severity- 7/10      Past Medical History:  Diagnosis Date  . Arthritis    Foot  . Depression   . Hypertension    Past Surgical History:  Procedure Laterality Date  . TUBAL LIGATION  2003   Social History   Socioeconomic History  . Marital status: Single    Spouse name: Not on file  . Number of children: 1  . Years of education: 67  . Highest education level: Not on file  Occupational History  . Occupation: Actuary  Social Needs  . Financial resource strain: Not on file  . Food insecurity:    Worry: Not on file    Inability: Not on file  . Transportation needs:    Medical: Not on file    Non-medical: Not on file  Tobacco Use  . Smoking status: Current Every Day Smoker    Packs/day: 0.50    Years: 34.00    Pack years: 17.00    Types: Cigarettes  . Smokeless tobacco: Never Used  Substance and Sexual Activity  . Alcohol use: Yes    Alcohol/week: 0.0 oz    Comment: socially  . Drug use: No  . Sexual activity: Yes    Birth control/protection: Surgical    Comment: INTERCOURSE AGE 46, SEXUAL PARTNERS MORE THAN 5  Lifestyle  . Physical activity:    Days per week: Not on file    Minutes per session: Not on file  . Stress: Not on file  Relationships  . Social connections:    Talks on phone: Not on file    Gets together: Not on file    Attends religious service: Not on file    Active member of club or organization: Not on file    Attends meetings of clubs  or organizations: Not on file    Relationship status: Not on file  Other Topics Concern  . Not on file  Social History Narrative   Fun: Adventurous outdoor activities, TV (games shows, [place]), sports   Denies any religious beliefs effecting health care.    Allergies  Allergen Reactions  . Ace Inhibitors Swelling   Family History  Problem Relation Age of Onset  . Hypertension Mother   . Cancer Father        prostate  . Hypertension Father   . Hypertension Sister   . Colon cancer Neg Hx   . Esophageal cancer Neg Hx   . Rectal cancer Neg Hx   . Stomach cancer Neg Hx      Past medical history, social, surgical and family history all reviewed in electronic medical record.  No pertanent information unless stated regarding to the chief complaint.   Review of Systems:Review of systems updated and as accurate as of 05/30/18  No headache, visual changes, nausea, vomiting, diarrhea, constipation, dizziness, abdominal pain, skin rash, fevers, chills, night sweats, weight loss, swollen lymph nodes, body aches, joint swelling,  chest pain, shortness of breath, mood changes.   Objective  Blood pressure  110/84, pulse 78, height 5\' 5"  (1.651 m), weight 166 lb (75.3 kg), last menstrual period 03/09/2012, SpO2 97 %. Systems examined below as of 05/30/18   General: No apparent distress alert and oriented x3 mood and affect normal, dressed appropriately.  HEENT: Pupils equal, extraocular movements intact  Respiratory: Patient's speak in full sentences and does not appear short of breath  Cardiovascular: No lower extremity edema, non tender, no erythema  Skin: Warm dry intact with no signs of infection or rash on extremities or on axial skeleton.  Abdomen: Soft nontender  Neuro: Cranial nerves II through XII are intact, neurovascularly intact in all extremities with 2+ DTRs and 2+ pulses.  Lymph: No lymphadenopathy of posterior or anterior cervical chain or axillae bilaterally.  Gait normal  with good balance and coordination.  MSK:  Non tender with full range of motion and good stability and symmetric strength and tone of shoulders, elbows, wrist, hip, knee bilaterally.  Right ankle exam shows the patient does have swelling over the posterior tibialis.  Severe breakdown of the longitudinal arch with overpronation of the hindfoot.  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  Full strength noted.  Tender over the posterior tibialis tendon. Contralateral ankle shows some mild overpronation of the hindfoot but no swelling noted.  MSK US performed of: Right ankle  this study was ordered, performed, and interpreted by Charlann Boxer D.O.  Foot/Ankle:   Right ankle shows the posterior tibial tendon has hypoechoic changes to the area.  Mild arthritic changes noted of the ankle mortise.  Possibly some narrowing of the tarsal tunnel noted.  IMPRESSION: Posterior tibialis tendinitis  [address]7110; 15 additional minutes spent for Therapeutic exercises as stated in above notes.  This included exercises focusing on stretching, strengthening, with significant focus on eccentric aspects.   Long term goals include an improvement in range of motion, strength, endurance as well as avoiding reinjury. Patient's frequency would include in 1-2 times a day, 3-5 times a week for a duration of 6-12 weeks. Ankle strengthening that included:  Basic range of motion exercises to allow proper full motion at ankle Stretching of the lower leg and hamstrings  Theraband exercises for the lower leg - inversion, eversion, dorsiflexion and plantarflexion each to be completed with a thera band which was given  Balance exercises to increase proprioception Weight bearing exercises to increase strength and balance  Proper technique shown and discussed handout in great detail with ATC.  All questions were discussed and answered.  .    Impression and Recommendations:      This case required medical decision making of moderate complexity.      Note: This dictation was prepared with Dragon dictation along with smaller phrase technology. Any transcriptional errors that result from this process are unintentional.

## 2018-05-30 ENCOUNTER — Ambulatory Visit: Payer: Self-pay

## 2018-05-30 ENCOUNTER — Encounter: Payer: Self-pay | Admitting: Family Medicine

## 2018-05-30 ENCOUNTER — Ambulatory Visit: Payer: BLUE CROSS/BLUE SHIELD | Admitting: Family Medicine

## 2018-05-30 VITALS — BP 110/84 | HR 78 | Ht 65.0 in | Wt 166.0 lb

## 2018-05-30 DIAGNOSIS — M76829 Posterior tibial tendinitis, unspecified leg: Secondary | ICD-10-CM

## 2018-05-30 DIAGNOSIS — M25531 Pain in right wrist: Secondary | ICD-10-CM

## 2018-05-30 DIAGNOSIS — M2142 Flat foot [pes planus] (acquired), left foot: Secondary | ICD-10-CM | POA: Diagnosis not present

## 2018-05-30 DIAGNOSIS — M2140 Flat foot [pes planus] (acquired), unspecified foot: Secondary | ICD-10-CM | POA: Insufficient documentation

## 2018-05-30 DIAGNOSIS — M2141 Flat foot [pes planus] (acquired), right foot: Secondary | ICD-10-CM

## 2018-05-30 MED ORDER — DICLOFENAC SODIUM 2 % TD SOLN: 2.0000 g | Freq: Two times a day (BID) | TRANSDERMAL | 3 refills | Status: DC

## 2018-05-30 NOTE — Assessment & Plan Note (Signed)
Posterior tibialis insufficiency.  The exercises given to work with Product/process development scientist.  Topical anti-inflammatories prescribed.  Discussed over-the-counter orthotics discussed the possible need for physical therapy or injections.  Follow-up again in 4 to 6 weeks

## 2018-05-30 NOTE — Patient Instructions (Signed)
Good to see you  Ice 20 minutes 2 times daily. Usually after activity and before bed. Exercises 3 times a week.  S[employer]co orthotics "total support" online would be great  Get new shoes.  Over the counter get  Vitamin D 2000 IU daily  Tart cherry extract any dose at night Iron 65mg  daily with 500mg  of vitamin C See me again in 4 weeks

## 2018-07-10 NOTE — Progress Notes (Signed)
[name] [address], [place] [address] Phone: [phone] Subjective:     CC: Right ankle pain follow-up, and pain  [id number]  Cynthia Ware is a 52 y.o. female coming in with complaint of right ankle pain. Is doing the exercises and does have pain with walking and being on her feet for prolonged periods.  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  Doing home exercises.  States that she still has chronic pain now.  Patient continues to have tingling in her right hand. She also has an achiness in the left wrist. Does notice a weakness on the right side.  States it seems to be worse in the mornings.  Sometimes a numbness.  Has not tried anything for it at this time.      Past Medical History:  Diagnosis Date  . Arthritis    Foot  . Depression   . Hypertension    Past Surgical History:  Procedure Laterality Date  . TUBAL LIGATION  2003   Social History   Socioeconomic History  . Marital status: Single    Spouse name: Not on file  . Number of children: 1  . Years of education: 58  . Highest education level: Not on file  Occupational History  . Occupation: Actuary  Social Needs  . Financial resource strain: Not on file  . Food insecurity:    Worry: Not on file    Inability: Not on file  . Transportation needs:    Medical: Not on file    Non-medical: Not on file  Tobacco Use  . Smoking status: Current Every Day Smoker    Packs/day: 0.50    Years: 34.00    Pack years: 17.00    Types: Cigarettes  . Smokeless tobacco: Never Used  Substance and Sexual Activity  . Alcohol use: Yes    Alcohol/week: 0.0 oz    Comment: socially  . Drug use: No  . Sexual activity: Yes    Birth control/protection: Surgical    Comment: INTERCOURSE AGE 83, SEXUAL PARTNERS MORE THAN 5  Lifestyle  . Physical activity:    Days per week: Not on file    Minutes per session: Not on file  . Stress: Not on file  Relationships  .  Social connections:    Talks on phone: Not on file    Gets together: Not on file    Attends religious service: Not on file    Active member of club or organization: Not on file    Attends meetings of clubs or organizations: Not on file    Relationship status: Not on file  Other Topics Concern  . Not on file  Social History Narrative   Fun: Adventurous outdoor activities, TV (games shows, [place]), sports   Denies any religious beliefs effecting health care.    Allergies  Allergen Reactions  . Ace Inhibitors Swelling   Family History  Problem Relation Age of Onset  . Hypertension Mother   . Cancer Father        prostate  . Hypertension Father   . Hypertension Sister   . Colon cancer Neg Hx   . Esophageal cancer Neg Hx   . Rectal cancer Neg Hx   . Stomach cancer Neg Hx      Past medical history, social, surgical and family history all reviewed in electronic medical record.  No pertanent information unless stated regarding to the chief complaint.   Review of Systems:Review of  systems updated and as accurate as of 07/11/18  No headache, visual changes, nausea, vomiting, diarrhea, constipation, dizziness, abdominal pain, skin rash, fevers, chills, night sweats, weight loss, swollen lymph nodes, body aches, joint swelling, muscle aches, chest pain, shortness of breath, mood changes.   Objective  Blood pressure 120/80, pulse 85, height 5\' 5"  (1.651 m), weight 162 lb (73.5 kg), last menstrual period 03/09/2012, SpO2 95 %. Systems examined below as of 07/11/18   General: No apparent distress alert and oriented x3 mood and affect normal, dressed appropriately.  HEENT: Pupils equal, extraocular movements intact  Respiratory: Patient's speak in full sentences and does not appear short of breath  Cardiovascular: No lower extremity edema, non tender, no erythema  Skin: Warm dry intact with no signs of infection or rash on extremities or on axial skeleton.  Abdomen: Soft nontender    Neuro: Cranial nerves II through XII are intact, neurovascularly intact in all extremities with 2+ DTRs and 2+ pulses.  Lymph: No lymphadenopathy of posterior or anterior cervical chain or axillae bilaterally.  Gait normal with good balance and coordination.  MSK:  Non tender with full range of motion and good stability and symmetric strength and tone of shoulders, elbows,  hip, knee and bilaterally.  Ankle: Right Swelling noted more of the posterior tibialis with overpronation of the hindfoot Mild loss of range of motion Strength is 4/5 in all directions. Stable lateral and medial ligaments; squeeze test and kleiger test unremarkable; Talar dome nontender; No pain at base of 5th MT; No tenderness over cuboid; No tenderness over N spot or navicular prominence No tenderness on posterior aspects of lateral and medial malleolus pain over the posterior tibialis tendon No sign of peroneal tendon subluxations or tenderness to palpation Negative tarsal tunnel tinel's Able to walk 4 steps.  Wrist: Lateral Inspection normal with no visible erythema or swelling. ROM smooth and normal with good flexion and extension and ulnar/radial deviation that is symmetrical with opposite wrist. Palpation is normal over metacarpals, navicular, lunate, and TFCC; tendons without tenderness/ swelling No snuffbox tenderness. No tenderness over Canal of Guyon. Strength 5/5 in all directions without pain. Negative Finkelstein, positive Tinel's and phalens.  Left greater than right Negative Watson's test.   Procedure: Real-time Ultrasound Guided Injection of right posterior tibialis tendinitis Device: GE Logiq Q7 Ultrasound guided injection is preferred based studies that show increased duration, increased effect, greater accuracy, decreased procedural pain, increased response rate, and decreased cost with ultrasound guided versus blind injection.  Verbal informed consent obtained.  Time-out conducted.  Noted no  overlying erythema, induration, or other signs of local infection.  Skin prepped in a sterile fashion.  Local anesthesia: Topical Ethyl chloride.  With sterile technique and under real time ultrasound guidance: With a 21-gauge 2 inch needle patient was injected with a total of 0.5 cc of 0.5% Marcaine and 0.5 cc of Kenalog 40 mg/mL Completed without difficulty  Pain immediately resolved suggesting accurate placement of the medication.  Advised to call if fevers/chills, erythema, induration, drainage, or persistent bleeding.  Images permanently stored and available for review in the ultrasound unit.  Impression: Technically successful ultrasound guided injection.  [id number]; 15 additional minutes spent for Therapeutic exercises as stated in above notes.  This included exercises focusing on stretching, strengthening, with significant focus on eccentric aspects.   Long term goals include an improvement in range of motion, strength, endurance as well as avoiding reinjury. Patient's frequency would include in 1-2 times a day, 3-5 times a week  for a duration of 6-12 weeks. Carpal Tunnel Syndrome: We discussed the anatomy involved, and that carpal tunnel syndrome primarily involves the median nerve, and this typically affects digits one through 3. We also discussed that mild cases of carpal tunnel syndrome are often improved with night splints, and it is very reasonable to consider a carpal tunnel injection. If the patient does have moderate to severe carpal tunnel syndrome based on NCV, then it is certainly reasonable to consider carpal tunnel release, which was discussed with the patient. We also discussed his severe carpal tunnel syndrome can lead to permanent nerve impairment even if released. At this point, the patient like to proceed conservatively.  Proper technique shown and discussed handout in great detail with ATC.  All questions were discussed and answered.     Impression and Recommendations:      This case required medical decision making of moderate complexity.      Note: This dictation was prepared with Dragon dictation along with smaller phrase technology. Any transcriptional errors that result from this process are unintentional.

## 2018-07-11 ENCOUNTER — Ambulatory Visit: Payer: BLUE CROSS/BLUE SHIELD | Admitting: Family Medicine

## 2018-07-11 ENCOUNTER — Encounter: Payer: Self-pay | Admitting: Family Medicine

## 2018-07-11 ENCOUNTER — Ambulatory Visit: Payer: Self-pay

## 2018-07-11 ENCOUNTER — Other Ambulatory Visit: Payer: Self-pay | Admitting: Nurse Practitioner

## 2018-07-11 VITALS — BP 120/80 | HR 85 | Ht 65.0 in | Wt 162.0 lb

## 2018-07-11 DIAGNOSIS — M25532 Pain in left wrist: Principal | ICD-10-CM

## 2018-07-11 DIAGNOSIS — G5603 Carpal tunnel syndrome, bilateral upper limbs: Secondary | ICD-10-CM | POA: Diagnosis not present

## 2018-07-11 DIAGNOSIS — M25531 Pain in right wrist: Secondary | ICD-10-CM | POA: Diagnosis not present

## 2018-07-11 DIAGNOSIS — M76829 Posterior tibial tendinitis, unspecified leg: Secondary | ICD-10-CM | POA: Diagnosis not present

## 2018-07-11 DIAGNOSIS — Z1231 Encounter for screening mammogram for malignant neoplasm of breast: Secondary | ICD-10-CM

## 2018-07-11 NOTE — Patient Instructions (Addendum)
Good to see you  Cynthia Ware is your friend.  Use brace day and night for 1 week then nightly for 2 weeks.  Exercises 3 times a week. \ pennsaid pinkie amount topically 2 times daily as needed.  Injected the ankle and contnue everything else you are doing for that  See me again in 6 weeks

## 2018-07-11 NOTE — Assessment & Plan Note (Signed)
Left greater than right, bracing, topical anti-inflammatories, will consider gabapentin if continues.  Discussed which activities of doing possible injections.  Follow-up again in 4 weeks

## 2018-07-11 NOTE — Assessment & Plan Note (Signed)
Patient given injection.  Discussed icing regimen, topical anti-inflammatories, proper shoes.  Encourage patient to home exercise.  Follow-up again in 4 weeks

## 2018-07-28 ENCOUNTER — Encounter: Payer: Self-pay | Admitting: Nurse Practitioner

## 2018-07-28 ENCOUNTER — Other Ambulatory Visit (INDEPENDENT_AMBULATORY_CARE_PROVIDER_SITE_OTHER): Payer: BLUE CROSS/BLUE SHIELD

## 2018-07-28 ENCOUNTER — Ambulatory Visit (INDEPENDENT_AMBULATORY_CARE_PROVIDER_SITE_OTHER): Payer: BLUE CROSS/BLUE SHIELD | Admitting: Nurse Practitioner

## 2018-07-28 VITALS — BP 134/80 | HR 86 | Ht 65.0 in | Wt 163.0 lb

## 2018-07-28 DIAGNOSIS — Z0001 Encounter for general adult medical examination with abnormal findings: Secondary | ICD-10-CM | POA: Diagnosis not present

## 2018-07-28 DIAGNOSIS — R7309 Other abnormal glucose: Secondary | ICD-10-CM

## 2018-07-28 DIAGNOSIS — I10 Essential (primary) hypertension: Secondary | ICD-10-CM

## 2018-07-28 DIAGNOSIS — Z13220 Encounter for screening for lipoid disorders: Secondary | ICD-10-CM

## 2018-07-28 DIAGNOSIS — F17219 Nicotine dependence, cigarettes, with unspecified nicotine-induced disorders: Secondary | ICD-10-CM

## 2018-07-28 LAB — LIPID PANEL
Cholesterol: 145 mg/dL (ref 0–200)
HDL: 60.9 mg/dL (ref >39.00)
LDL Cholesterol: 60 mg/dL (ref 0–99)
NONHDL: 84.57
Total CHOL/HDL Ratio: 2
Triglycerides: 125 mg/dL (ref 0.0–149.0)
VLDL: 25 mg/dL (ref 0.0–40.0)

## 2018-07-28 LAB — COMPREHENSIVE METABOLIC PANEL
ALT: 14 U/L (ref 0–35)
AST: 14 U/L (ref 0–37)
Albumin: 4.1 g/dL (ref 3.5–5.2)
Alkaline Phosphatase: 87 U/L (ref 39–117)
BUN: 16 mg/dL (ref 6–23)
CHLORIDE: 102 meq/L (ref 96–112)
CO2: 31 meq/L (ref 19–32)
Calcium: 9.6 mg/dL (ref 8.4–10.5)
Creatinine, Ser: 0.88 mg/dL (ref 0.40–1.20)
GFR: 86 mL/min (ref >60.00)
GLUCOSE: 93 mg/dL (ref 70–99)
POTASSIUM: 3.4 meq/L — AB (ref 3.5–5.1)
SODIUM: 140 meq/L (ref 135–145)
TOTAL PROTEIN: 7.5 g/dL (ref 6.0–8.3)
Total Bilirubin: 0.4 mg/dL (ref 0.2–1.2)

## 2018-07-28 LAB — CBC
HCT: 37.7 % (ref 36.0–46.0)
HEMOGLOBIN: 12.5 g/dL (ref 12.0–15.0)
MCHC: 33.1 g/dL (ref 30.0–36.0)
MCV: 86.2 fl (ref 78.0–100.0)
PLATELETS: 371 10*3/uL (ref 150.0–400.0)
RBC: 4.37 Mil/uL (ref 3.87–5.11)
RDW: 16.2 % — AB (ref 11.5–15.5)
WBC: 7.4 10*3/uL (ref 4.0–10.5)

## 2018-07-28 LAB — HEMOGLOBIN A1C: HEMOGLOBIN A1C: 6.1 % (ref 4.6–6.5)

## 2018-07-28 LAB — TSH: TSH: 0.41 u[IU]/mL (ref 0.35–4.50)

## 2018-07-28 MED ORDER — VARENICLINE TARTRATE 0.5 MG PO TABS: 0.5000 mg | ORAL_TABLET | Freq: Two times a day (BID) | ORAL | 0 refills | Status: DC

## 2018-07-28 MED ORDER — VARENICLINE TARTRATE 1 MG PO TABS: 1.0000 mg | ORAL_TABLET | Freq: Two times a day (BID) | ORAL | 1 refills | Status: DC

## 2018-07-28 NOTE — Assessment & Plan Note (Signed)
Stable Continue current medications update labs Continue to monitor - CBC; Future - TSH; Future - Comprehensive metabolic panel; Future

## 2018-07-28 NOTE — Assessment & Plan Note (Signed)
Asked about tobacco use. She was advised to quit. Assess willingness to make an attempt: She is motivated and ready to quit. Assist in quit attempt: We discussed both medication options and behavoiral modifications. 1-800-QUIT-NOW (daily talk therapy); squeeze ball, juggling change, paper log of # spent She would like to try chantix -Side effects and medication dosing discussed- including nausea and vivid dreams-nausea should improve over time- start chantix then quit in 2 weeks (or decrease by half) Pt education handout given. 3 minutes were spent counseling on smoking risk factors and going over medications for assistance. Arrange follow up: She will RTC in about 2 weeks for follow up. - varenicline (CHANTIX) 0.5 MG tablet; Take 1 tablet (0.5 mg total) by mouth 2 (two) times daily.  Dispense: 11 tablet; Refill: 0 - varenicline (CHANTIX CONTINUING MONTH PAK) 1 MG tablet; Take 1 tablet (1 mg total) by mouth 2 (two) times daily.  Dispense: 60 tablet; Refill: 1

## 2018-07-28 NOTE — Patient Instructions (Addendum)
I am proud of you for making the decision to quit smoking!! I have sent the prescription for chantix. On Days 1 to 3, you will take 0.5 mg once daily On Days 4 to 7, you will take  0.5 mg twice daily Then after Day 7, you will take 1 mg twice daily for up to 11 weeks. You should plan to quite smoking 2 weeks after starting the chantix. The chantix will come in 2 packs, one starter pack for days 1-7, then a continuing pack with refills to start on day 8. It will also be helpful to get a stress ball or something else that you can use to keep your hands busy when you get the urge to smoke. The chantix does make people feel very nauseated at first, but this will get better after a few days. Also, some people say that it makes them have crazy dreams, but no nightmares or bad dreams. I'd like for you to come in to see me in about 1 month, to make sure you are doing okay on the chantix and see how you are doing with quitting smoking. 1-800-QUIT-NOW can also offer daily talk therapy or other help if you need.  Please return in about 1 month so I can see how you are doing on the chantix.  Health Maintenance, Female Adopting a healthy lifestyle and getting preventive care can go a long way to promote health and wellness. Talk with your health care provider about what schedule of regular examinations is right for you. This is a good chance for you to check in with your provider about disease prevention and staying healthy. In between checkups, there are plenty of things you can do on your own. Experts have done a lot of research about which lifestyle changes and preventive measures are most likely to keep you healthy. Ask your health care provider for more information. Weight and diet Eat a healthy diet  Be sure to include plenty of vegetables, fruits, low-fat dairy products, and lean protein.  Do not eat a lot of foods high in solid fats, added sugars, or salt.  Get regular exercise. This is one of the  most important things you can do for your health. ? Most adults should exercise for at least 150 minutes each week. The exercise should increase your heart rate and make you sweat (moderate-intensity exercise). ? Most adults should also do strengthening exercises at least twice a week. This is in addition to the moderate-intensity exercise.  Maintain a healthy weight  Body mass index (BMI) is a measurement that can be used to identify possible weight problems. It estimates body fat based on height and weight. Your health care provider can help determine your BMI and help you achieve or maintain a healthy weight.  For females 53 years of age and older: ? A BMI below 18.5 is considered underweight. ? A BMI of 18.5 to 24.9 is normal. ? A BMI of 25 to 29.9 is considered overweight. ? A BMI of 30 and above is considered obese.  Watch levels of cholesterol and blood lipids  You should start having your blood tested for lipids and cholesterol at 54 years of age, then have this test every 5 years.  You may need to have your cholesterol levels checked more often if: ? Your lipid or cholesterol levels are high. ? You are older than 54 years of age. ? You are at high risk for heart disease.  Cancer screening Lung Cancer  Lung cancer screening is recommended for adults 29-61 years old who are at high risk for lung cancer because of a history of smoking.  A yearly low-dose CT scan of the lungs is recommended for people who: ? Currently smoke. ? Have quit within the past 15 years. ? Have at least a 30-pack-year history of smoking. A pack year is smoking an average of one pack of cigarettes a day for 1 year.  Yearly screening should continue until it has been 15 years since you quit.  Yearly screening should stop if you develop a health problem that would prevent you from having lung cancer treatment.  Breast Cancer  Practice breast self-awareness. This means understanding how your breasts  normally appear and feel.  It also means doing regular breast self-exams. Let your health care provider know about any changes, no matter how small.  If you are in your 20s or 30s, you should have a clinical breast exam (CBE) by a health care provider every 1-3 years as part of a regular health exam.  If you are 74 or older, have a CBE every year. Also consider having a breast X-ray (mammogram) every year.  If you have a family history of breast cancer, talk to your health care provider about genetic screening.  If you are at high risk for breast cancer, talk to your health care provider about having an MRI and a mammogram every year.  Breast cancer gene (BRCA) assessment is recommended for women who have family members with BRCA-related cancers. BRCA-related cancers include: ? Breast. ? Ovarian. ? Tubal. ? Peritoneal cancers.  Results of the assessment will determine the need for genetic counseling and BRCA1 and BRCA2 testing.  Cervical Cancer Your health care provider may recommend that you be screened regularly for cancer of the pelvic organs (ovaries, uterus, and vagina). This screening involves a pelvic examination, including checking for microscopic changes to the surface of your cervix (Pap test). You may be encouraged to have this screening done every 3 years, beginning at age 33.  For women ages 95-65, health care providers may recommend pelvic exams and Pap testing every 3 years, or they may recommend the Pap and pelvic exam, combined with testing for human papilloma virus (HPV), every 5 years. Some types of HPV increase your risk of cervical cancer. Testing for HPV may also be done on women of any age with unclear Pap test results.  Other health care providers may not recommend any screening for nonpregnant women who are considered low risk for pelvic cancer and who do not have symptoms. Ask your health care provider if a screening pelvic exam is right for you.  If you have had  past treatment for cervical cancer or a condition that could lead to cancer, you need Pap tests and screening for cancer for at least 20 years after your treatment. If Pap tests have been discontinued, your risk factors (such as having a new sexual partner) need to be reassessed to determine if screening should resume. Some women have medical problems that increase the chance of getting cervical cancer. In these cases, your health care provider may recommend more frequent screening and Pap tests.  Colorectal Cancer  This type of cancer can be detected and often prevented.  Routine colorectal cancer screening usually begins at 54 years of age and continues through 54 years of age.  Your health care provider may recommend screening at an earlier age if you have risk factors for colon cancer.  Your  health care provider may also recommend using home test kits to check for hidden blood in the stool.  A small camera at the end of a tube can be used to examine your colon directly (sigmoidoscopy or colonoscopy). This is done to check for the earliest forms of colorectal cancer.  Routine screening usually begins at age 69.  Direct examination of the colon should be repeated every 5-10 years through 54 years of age. However, you may need to be screened more often if early forms of precancerous polyps or small growths are found.  Skin Cancer  Check your skin from head to toe regularly.  Tell your health care provider about any new moles or changes in moles, especially if there is a change in a mole's shape or color.  Also tell your health care provider if you have a mole that is larger than the size of a pencil eraser.  Always use sunscreen. Apply sunscreen liberally and repeatedly throughout the day.  Protect yourself by wearing long sleeves, pants, a wide-brimmed hat, and sunglasses whenever you are outside.  Heart disease, diabetes, and high blood pressure  High blood pressure causes heart  disease and increases the risk of stroke. High blood pressure is more likely to develop in: ? People who have blood pressure in the high end of the normal range (130-139/85-89 mm Hg). ? People who are overweight or obese. ? People who are African American.  If you are 48-92 years of age, have your blood pressure checked every 3-5 years. If you are 68 years of age or older, have your blood pressure checked every year. You should have your blood pressure measured twice-once when you are at a hospital or clinic, and once when you are not at a hospital or clinic. Record the average of the two measurements. To check your blood pressure when you are not at a hospital or clinic, you can use: ? An automated blood pressure machine at a pharmacy. ? A home blood pressure monitor.  If you are between 22 years and 14 years old, ask your health care provider if you should take aspirin to prevent strokes.  Have regular diabetes screenings. This involves taking a blood sample to check your fasting blood sugar level. ? If you are at a normal weight and have a low risk for diabetes, have this test once every three years after 55 years of age. ? If you are overweight and have a high risk for diabetes, consider being tested at a younger age or more often. Preventing infection Hepatitis B  If you have a higher risk for hepatitis B, you should be screened for this virus. You are considered at high risk for hepatitis B if: ? You were born in a country where hepatitis B is common. Ask your health care provider which countries are considered high risk. ? Your parents were born in a high-risk country, and you have not been immunized against hepatitis B (hepatitis B vaccine). ? You have HIV or AIDS. ? You use needles to inject street drugs. ? You live with someone who has hepatitis B. ? You have had sex with someone who has hepatitis B. ? You get hemodialysis treatment. ? You take certain medicines for conditions,  including cancer, organ transplantation, and autoimmune conditions.  Hepatitis C  Blood testing is recommended for: ? Everyone born from [birth year] through 1967. ? Anyone with known risk factors for hepatitis C.  Sexually transmitted infections (STIs)  You should be screened for  sexually transmitted infections (STIs) including gonorrhea and chlamydia if: ? You are sexually active and are younger than 54 years of age. ? You are older than 54 years of age and your health care provider tells you that you are at risk for this type of infection. ? Your sexual activity has changed since you were last screened and you are at an increased risk for chlamydia or gonorrhea. Ask your health care provider if you are at risk.  If you do not have HIV, but are at risk, it may be recommended that you take a prescription medicine daily to prevent HIV infection. This is called pre-exposure prophylaxis (PrEP). You are considered at risk if: ? You are sexually active and do not regularly use condoms or know the HIV status of your partner(s). ? You take drugs by injection. ? You are sexually active with a partner who has HIV.  Talk with your health care provider about whether you are at high risk of being infected with HIV. If you choose to begin PrEP, you should first be tested for HIV. You should then be tested every 3 months for as long as you are taking PrEP. Pregnancy  If you are premenopausal and you may become pregnant, ask your health care provider about preconception counseling.  If you may become pregnant, take 400 to 800 micrograms (mcg) of folic acid every day.  If you want to prevent pregnancy, talk to your health care provider about birth control (contraception). Osteoporosis and menopause  Osteoporosis is a disease in which the bones lose minerals and strength with aging. This can result in serious bone fractures. Your risk for osteoporosis can be identified using a bone density scan.  If you are  35 years of age or older, or if you are at risk for osteoporosis and fractures, ask your health care provider if you should be screened.  Ask your health care provider whether you should take a calcium or vitamin D supplement to lower your risk for osteoporosis.  Menopause may have certain physical symptoms and risks.  Hormone replacement therapy may reduce some of these symptoms and risks. Talk to your health care provider about whether hormone replacement therapy is right for you. Follow these instructions at home:  Schedule regular health, dental, and eye exams.  Stay current with your immunizations.  Do not use any tobacco products including cigarettes, chewing tobacco, or electronic cigarettes.  If you are pregnant, do not drink alcohol.  If you are breastfeeding, limit how much and how often you drink alcohol.  Limit alcohol intake to no more than 1 drink per day for nonpregnant women. One drink equals 12 ounces of beer, 5 ounces of wine, or 1 ounces of hard liquor.  Do not use street drugs.  Do not share needles.  Ask your health care provider for help if you need support or information about quitting drugs.  Tell your health care provider if you often feel depressed.  Tell your health care provider if you have ever been abused or do not feel safe at home. This information is not intended to replace advice given to you by your health care provider. Make sure you discuss any questions you have with your health care provider. Document Released: 06/29/2011 Document Revised: 05/21/2016 Document Reviewed: 09/17/2015 Elsevier Interactive Patient Education  [employer].

## 2018-07-28 NOTE — Assessment & Plan Note (Signed)
-  USPSTF grade A and B recommendations reviewed with patient; age-appropriate recommendations, preventive care, screening tests, etc discussed and encouraged; healthy living encouraged; see AVS for patient education given to patient -Discussed importance of 150 minutes of physical activity weekly,  eat 6 servings of fruit/vegetables daily and drink plenty of water and avoid sweet beverages.  -Follow up and care instructions discussed and provided in AVS.  -Reviewed Health Maintenance:  - Lipid panel; Future-she is not fasting, eggs and sausage for breakfast, will update lipid panel today for convenience-Screening for cholesterol level  Elevated glucose - Hemoglobin A1c; Future

## 2018-07-28 NOTE — Progress Notes (Signed)
Name: BELISA EICHHOLZ   MRN: [id number]    DOB: 02-May-1966   Date:07/28/2018       Progress Note  Subjective  HPI  Patient presents for annual CPE.  Diet, Exercise: trying to watch her diet and exercise to lose weight, works as Secretary/administrator and very active at work  USPSTF grade A and B recommendations  Hypertension -maintained on amlodipine 10, HCTZ 25 daily Reports daily medication compliance without adverse medication effects.  BP Readings from Last 3 Encounters:  07/28/18 134/80  07/11/18 120/80  05/30/18 110/84   Obesity: Wt Readings from Last 3 Encounters:  07/28/18 163 lb (73.9 kg)  07/11/18 162 lb (73.5 kg)  05/30/18 166 lb (75.3 kg)   BMI Readings from Last 3 Encounters:  07/28/18 27.12 kg/m  07/11/18 26.96 kg/m  05/30/18 27.62 kg/m    Alcohol: occasional social drink  Tobacco use: current smoker for about 20 years, has tried to quit cold [place], wellbutrin in the past without success but does say today that she would like to try quitting again  HIV, hep C: screening done in past STD testing and prevention (chl/gon/syphilis): no concerns, declines testing  Intimate partner violence: feels safe  Vaccinations: up to date  Advanced Care Planning: A voluntary discussion about advance care planning including the explanation and discussion of advance directives.  Discussed health care proxy and Living will, and the patient DOES have a living will at present time. If patient does have living will, I have requested they bring this to the clinic to be scanned in to their chart.  Breast cancer: mammogram up to date, next scheduled mammogram upcoming 08/09/18 Cervical cancer screening: follows with womens health provider for routine womens health care and PAP   Lipids: lipid panel ordered today Lab Results  Component Value Date   CHOL 166 07/23/2017   CHOL 153 07/24/2015   Lab Results  Component Value Date   HDL 69.00 07/23/2017   HDL 52.80 07/24/2015   Lab  Results  Component Value Date   LDLCALC 85 07/23/2017   [place] 85 07/24/2015   Lab Results  Component Value Date   TRIG 62.0 07/23/2017   TRIG 76.0 07/24/2015   Lab Results  Component Value Date   CHOLHDL 2 07/23/2017   CHOLHDL 3 07/24/2015   No results found for: LDLDIRECT  Glucose: A1c today Glucose, Bld  Date Value Ref Range Status  03/01/2018 78 70 - 99 mg/dL Final  07/23/2017 110 (H) 70 - 99 mg/dL Final  07/24/2015 92 70 - 99 mg/dL Final    Skin cancer: does not routinely wear sunscreen Colorectal cancer: colonoscopy up to date  Aspirin: not indicated ECG: not indicated   Patient Active Problem List   Diagnosis Date Noted  . Carpal tunnel syndrome on both sides 07/11/2018  . 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 Pes planus 05/30/2018  . Encounter for general adult medical examination with abnormal findings 07/23/2017  . Lesion of breast 07/23/2017  . Essential hypertension 07/23/2017  . Acute bronchitis 12/17/2016  . Boil, face 05/15/2016  . Routine general medical examination at a health care facility 06/03/2015  . Pain and swelling of left lower leg 06/03/2015  . Fibroids 04/27/2012  . Abnormal uterine bleeding 04/27/2012    Past Surgical History:  Procedure Laterality Date  . TUBAL LIGATION  2003    Family History  Problem Relation Age of Onset  . Hypertension Mother   . Cancer Father  prostate  . Hypertension Father   . Hypertension Sister   . Colon cancer Neg Hx   . Esophageal cancer Neg Hx   . Rectal cancer Neg Hx   . Stomach cancer Neg Hx     Social History   Socioeconomic History  . Marital status: Single    Spouse name: Not on file  . Number of children: 1  . Years of education: 71  . Highest education level: Not on file  Occupational History  . Occupation: Actuary  Social Needs  . Financial resource strain: Not on file  . Food insecurity:    Worry: Not on file    Inability: Not on file  .  Transportation needs:    Medical: Not on file    Non-medical: Not on file  Tobacco Use  . Smoking status: Current Every Day Smoker    Packs/day: 0.50    Years: 34.00    Pack years: 17.00    Types: Cigarettes  . Smokeless tobacco: Never Used  Substance and Sexual Activity  . Alcohol use: Yes    Alcohol/week: 0.0 oz    Comment: socially  . Drug use: No  . Sexual activity: Yes    Birth control/protection: Surgical    Comment: INTERCOURSE AGE 49, SEXUAL PARTNERS MORE THAN 5  Lifestyle  . Physical activity:    Days per week: Not on file    Minutes per session: Not on file  . Stress: Not on file  Relationships  . Social connections:    Talks on phone: Not on file    Gets together: Not on file    Attends religious service: Not on file    Active member of club or organization: Not on file    Attends meetings of clubs or organizations: Not on file    Relationship status: Not on file  . Intimate partner violence:    Fear of current or ex partner: Not on file    Emotionally abused: Not on file    Physically abused: Not on file    Forced sexual activity: Not on file  Other Topics Concern  . Not on file  Social History Narrative   Fun: Adventurous outdoor activities, TV (games shows, [place]), sports   Denies any religious beliefs effecting health care.      Current Outpatient Medications:  .  amLODipine (NORVASC) 10 MG tablet, Take 1 tablet (10 mg total) by mouth daily., Disp: 90 tablet, Rfl: 1 .  Diclofenac Sodium (PENNSAID) 2 % SOLN, Place 2 g onto the skin 2 (two) times daily., Disp: 112 g, Rfl: 3 .  hydrochlorothiazide (HYDRODIURIL) 25 MG tablet, Take 1 tablet (25 mg total) by mouth daily., Disp: 90 tablet, Rfl: 2  Allergies  Allergen Reactions  . Ace Inhibitors Swelling     ROS  Constitutional: Negative for fever or weight change.  Respiratory: Negative for cough and shortness of breath.   Cardiovascular: Negative for chest pain or palpitations.   Gastrointestinal: Negative for abdominal pain, no bowel changes.  Musculoskeletal: Negative for gait problem or joint swelling.  Skin: Negative for rash.  Neurological: Negative for dizziness or headache.  No other specific complaints in a complete review of systems (except as listed in HPI above).   Objective  Vitals:   07/28/18 0958  BP: 134/80  Pulse: 86  SpO2: 98%  Weight: 163 lb (73.9 kg)  Height: 5\' 5"  (1.651 m)    Body mass index is 27.12 kg/m.  Physical Exam Vital signs reviewed.  Constitutional: Patient appears well-developed and well-nourished. No distress.  HENT: Head: Normocephalic and atraumatic. Ears: B TMs ok, no erythema or effusion; Nose: Nose normal. Mouth/Throat: Oropharynx is clear and moist. No oropharyngeal exudate.  Eyes: Conjunctivae and EOM are normal. Pupils are equal, round, and reactive to light. No scleral icterus.  Neck: Normal range of motion. Neck supple. No cervical adenopathy. No thyromegaly present.  Cardiovascular: Normal rate, regular rhythm and normal heart sounds.  No murmur heard. Distal pulses intact Pulmonary/Chest: Effort normal and breath sounds normal. No respiratory distress. Abdominal: Soft. Bowel sounds are normal, no distension. There is no tenderness. no masses Breast: defd to mammogram FEMALE GENITALIA:  Defd to GYN Musculoskeletal: Normal range of motion,  No gross deformities Neurological: She is alert and oriented to person, place, and time. No cranial nerve deficit. Coordination, balance, strength, speech and gait are normal.  Skin: Skin is warm and dry. No rash noted. No erythema.  Psychiatric: Patient has a normal mood and affect. behavior is normal. Judgment and thought content normal.   Assessment & Plan RTC in 1 month for F/U: Smoking cessation-starting chantix

## 2018-08-08 ENCOUNTER — Other Ambulatory Visit: Payer: Self-pay | Admitting: Nurse Practitioner

## 2018-08-08 DIAGNOSIS — E876 Hypokalemia: Secondary | ICD-10-CM

## 2018-08-08 MED ORDER — POTASSIUM CHLORIDE CRYS ER 20 MEQ PO TBCR: 20.0000 meq | EXTENDED_RELEASE_TABLET | Freq: Every day | ORAL | 0 refills | Status: DC

## 2018-08-09 ENCOUNTER — Ambulatory Visit: Payer: BLUE CROSS/BLUE SHIELD

## 2018-09-02 ENCOUNTER — Ambulatory Visit
Admission: RE | Admit: 2018-09-02 | Discharge: 2018-09-02 | Disposition: A | Discharge status: Home / Self Care | Payer: BLUE CROSS/BLUE SHIELD | Source: Ambulatory Visit | Attending: Nurse Practitioner | Admitting: Nurse Practitioner

## 2018-09-02 DIAGNOSIS — Z1231 Encounter for screening mammogram for malignant neoplasm of breast: Secondary | ICD-10-CM

## 2018-09-12 ENCOUNTER — Ambulatory Visit: Payer: BLUE CROSS/BLUE SHIELD | Admitting: Family Medicine

## 2018-09-12 ENCOUNTER — Ambulatory Visit: Payer: BLUE CROSS/BLUE SHIELD | Admitting: Nurse Practitioner

## 2018-09-28 ENCOUNTER — Ambulatory Visit: Payer: BLUE CROSS/BLUE SHIELD | Admitting: Nurse Practitioner

## 2018-09-28 ENCOUNTER — Other Ambulatory Visit (INDEPENDENT_AMBULATORY_CARE_PROVIDER_SITE_OTHER): Payer: BLUE CROSS/BLUE SHIELD

## 2018-09-28 ENCOUNTER — Other Ambulatory Visit: Payer: Self-pay | Admitting: Nurse Practitioner

## 2018-09-28 ENCOUNTER — Encounter: Payer: Self-pay | Admitting: Nurse Practitioner

## 2018-09-28 VITALS — BP 110/88 | HR 80 | Temp 98.0°F | Ht 65.0 in | Wt 165.0 lb

## 2018-09-28 DIAGNOSIS — J40 Bronchitis, not specified as acute or chronic: Secondary | ICD-10-CM

## 2018-09-28 DIAGNOSIS — F17219 Nicotine dependence, cigarettes, with unspecified nicotine-induced disorders: Secondary | ICD-10-CM | POA: Diagnosis not present

## 2018-09-28 DIAGNOSIS — E876 Hypokalemia: Secondary | ICD-10-CM

## 2018-09-28 LAB — BASIC METABOLIC PANEL
BUN: 15 mg/dL (ref 6–23)
CHLORIDE: 99 meq/L (ref 96–112)
CO2: 33 mEq/L — ABNORMAL HIGH (ref 19–32)
Calcium: 9.5 mg/dL (ref 8.4–10.5)
Creatinine, Ser: 0.78 mg/dL (ref 0.40–1.20)
GFR: 98.78 mL/min (ref >60.00)
GLUCOSE: 103 mg/dL — AB (ref 70–99)
POTASSIUM: 3.5 meq/L (ref 3.5–5.1)
SODIUM: 139 meq/L (ref 135–145)

## 2018-09-28 MED ORDER — DOXYCYCLINE HYCLATE 100 MG PO TABS: 100.0000 mg | ORAL_TABLET | Freq: Two times a day (BID) | ORAL | 0 refills | Status: DC

## 2018-09-28 NOTE — Progress Notes (Signed)
Name: Cynthia Ware   MRN: [id number]    DOB: 07-25-1966   Date:09/28/2018       Progress Note  Subjective  Chief Complaint Follow up  HPI  Cynthia Ware is here today for follow up of smoking cessation- started chantix at her CPE on 07/28/18. She started the chantix, seemed to work for some time but then when she started working 2 jobs, she actually started smoking more, and just really does not feel like the chantix is helping. She is not interested in trying any other medications for smoking today, wellbutrin didn't work in the past. She is going to keep trying to quit on her own. She is also requesting evaluation of cough, cold today, first started about 2 weeks ago with malaise, sore throat, cough, overall seems to have improved but does continue to feel fatigued, coughing yellow sputum.  She denies fevers, chills, syncope, headaches, chest pain, shortness of breath. Has tried OTC cold/flu pills with no real improvement.   Patient Active Problem List   Diagnosis Date Noted  . Cigarette nicotine dependence with nicotine-induced disorder 07/28/2018  . Carpal tunnel syndrome on both sides 07/11/2018  . 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 Pes planus 05/30/2018  . Encounter for general adult medical examination with abnormal findings 07/23/2017  . Lesion of breast 07/23/2017  . Essential hypertension 07/23/2017  . Boil, face 05/15/2016  . Routine general medical examination at a health care facility 06/03/2015  . Fibroids 04/27/2012  . Abnormal uterine bleeding 04/27/2012    Past Surgical History:  Procedure Laterality Date  . TUBAL LIGATION  2003    Family History  Problem Relation Age of Onset  . Hypertension Mother   . Cancer Father        prostate  . Hypertension Father   . Hypertension Sister   . Colon cancer Neg Hx   . Esophageal cancer Neg Hx   . Rectal cancer Neg Hx   . Stomach cancer Neg Hx   . Breast cancer Neg Hx     Social History    Socioeconomic History  . Marital status: Single    Spouse name: Not on file  . Number of children: 1  . Years of education: 54  . Highest education level: Not on file  Occupational History  . Occupation: Actuary  Social Needs  . Financial resource strain: Not on file  . Food insecurity:    Worry: Not on file    Inability: Not on file  . Transportation needs:    Medical: Not on file    Non-medical: Not on file  Tobacco Use  . Smoking status: Current Every Day Smoker    Packs/day: 0.50    Years: 34.00    Pack years: 17.00    Types: Cigarettes  . Smokeless tobacco: Never Used  Substance and Sexual Activity  . Alcohol use: Yes    Alcohol/week: 0.0 standard drinks    Comment: socially  . Drug use: No  . Sexual activity: Yes    Birth control/protection: Surgical    Comment: INTERCOURSE AGE 19, SEXUAL PARTNERS MORE THAN 5  Lifestyle  . Physical activity:    Days per week: Not on file    Minutes per session: Not on file  . Stress: Not on file  Relationships  . Social connections:    Talks on phone: Not on file    Gets together: Not on file    Attends religious service: Not on file  Active member of club or organization: Not on file    Attends meetings of clubs or organizations: Not on file    Relationship status: Not on file  . Intimate partner violence:    Fear of current or ex partner: Not on file    Emotionally abused: Not on file    Physically abused: Not on file    Forced sexual activity: Not on file  Other Topics Concern  . Not on file  Social History Narrative   Fun: Adventurous outdoor activities, TV (games shows, [place]), sports   Denies any religious beliefs effecting health care.      Current Outpatient Medications:  .  amLODipine (NORVASC) 10 MG tablet, Take 1 tablet (10 mg total) by mouth daily., Disp: 90 tablet, Rfl: 1 .  Diclofenac Sodium (PENNSAID) 2 % SOLN, Place 2 g onto the skin 2 (two) times daily., Disp: 112 g, Rfl: 3 .   hydrochlorothiazide (HYDRODIURIL) 25 MG tablet, Take 1 tablet (25 mg total) by mouth daily., Disp: 90 tablet, Rfl: 2 .  potassium chloride SA (K-DUR,KLOR-CON) 20 MEQ tablet, Take 1 tablet (20 mEq total) by mouth daily., Disp: 5 tablet, Rfl: 0 .  varenicline (CHANTIX CONTINUING MONTH PAK) 1 MG tablet, Take 1 tablet (1 mg total) by mouth 2 (two) times daily., Disp: 60 tablet, Rfl: 1 .  varenicline (CHANTIX) 0.5 MG tablet, Take 1 tablet (0.5 mg total) by mouth 2 (two) times daily., Disp: 11 tablet, Rfl: 0  Allergies  Allergen Reactions  . Ace Inhibitors Swelling     ROS See HPI  Objective  Vitals:   09/28/18 0841  BP: 110/88  Pulse: 80  Temp: 98 F (36.7 C)  TempSrc: Oral  SpO2: 97%  Weight: 165 lb (74.8 kg)  Height: 5\' 5"  (1.651 m)    Body mass index is 27.46 kg/m.  Physical Exam Vital signs reviewed. Constitutional: Patient appears well-developed and well-nourished. No distress.  HENT: Head: Normocephalic and atraumatic. Ears: Bilateral TMs without erythema or effusion; Nose: Nose normal. Mouth/Throat: Oropharynx is clear and moist. No oropharyngeal exudate.  Eyes: Conjunctivae and EOM are normal. Pupils are equal, round, and reactive to light. No scleral icterus.  Neck: Normal range of motion. Neck supple.  No cervical adenopathy. Cardiovascular: Normal rate, regular rhythm and normal heart sounds.  No murmur heard. No BLE edema. Distal pulses intact. Pulmonary/Chest: Effort normal and breath sounds normal. No respiratory distress. Neurological: She is alert and oriented to person, place, and time. No cranial nerve deficit. Coordination, balance, strength, speech and gait are normal.  Skin: Skin is warm and dry. No rash noted. No erythema.  Psychiatric: Patient has a normal mood and affect. behavior is normal. Judgment and thought content normal.    Assessment & Plan RTC in about 4 months for regular follow up  Bronchitis Due to duration of symptoms will treat with  course of abx- dosing and side effects discussed. Home management, Red flags and return precautions discussed and printed on AVS - doxycycline (VIBRA-TABS) 100 MG tablet; Take 1 tablet (100 mg total) by mouth 2 (two) times daily.  Dispense: 14 tablet; Refill: 0

## 2018-09-28 NOTE — Assessment & Plan Note (Addendum)
Asked about tobacco use. She was advised and encouraged to quit. Assess willingness to make an attempt: She is not interested in additional medication options today. Assist in quit attempt: We discussed both medication options and behavoiral modifications, provided additional information on AVS Pt education handout given.  Arrange follow up: F/U as needed

## 2018-09-28 NOTE — Patient Instructions (Addendum)
Please complete antibiotic for cough.  Please follow up for fevers over 101, if your symptoms get worse, or if your symptoms dont get better with the antibiotic.   Acute Bronchitis, Adult Acute bronchitis is when air tubes (bronchi) in the lungs suddenly get swollen. The condition can make it hard to breathe. It can also cause these symptoms:  A cough.  Coughing up clear, yellow, or green mucus.  Wheezing.  Chest congestion.  Shortness of breath.  A fever.  Body aches.  Chills.  A sore throat.  Follow these instructions at home: Medicines  Take over-the-counter and prescription medicines only as told by your doctor.  If you were prescribed an antibiotic medicine, take it as told by your doctor. Do not stop taking the antibiotic even if you start to feel better. General instructions  Rest.  Drink enough fluids to keep your pee (urine) clear or pale yellow.  Avoid smoking and secondhand smoke. If you smoke and you need help quitting, ask your doctor. Quitting will help your lungs heal faster.  Use an inhaler, cool mist vaporizer, or humidifier as told by your doctor.  Keep all follow-up visits as told by your doctor. This is important. How is this prevented? To lower your risk of getting this condition again:  Wash your hands often with soap and water. If you cannot use soap and water, use hand sanitizer.  Avoid contact with people who have cold symptoms.  Try not to touch your hands to your mouth, nose, or eyes.  Make sure to get the flu shot every year.  Contact a doctor if:  Your symptoms do not get better in 2 weeks. Get help right away if:  You cough up blood.  You have chest pain.  You have very bad shortness of breath.  You become dehydrated.  You faint (pass out) or keep feeling like you are going to pass out.  You keep throwing up (vomiting).  You have a very bad headache.  Your fever or chills gets worse. This information is not  intended to replace advice given to you by your health care provider. Make sure you discuss any questions you have with your health care provider. Document Released: 06/01/2008 Document Revised: 07/22/2016 Document Reviewed: 06/03/2016 Elsevier Interactive Patient Education  2018 [employer].    Steps to Quit Smoking Smoking tobacco can be bad for your health. It can also affect almost every organ in your body. Smoking puts you and people around you at risk for many serious long-lasting (chronic) diseases. Quitting smoking is hard, but it is one of the best things that you can do for your health. It is never too late to quit. What are the benefits of quitting smoking? When you quit smoking, you lower your risk for getting serious diseases and conditions. They can include:  Lung cancer or lung disease.  Heart disease.  Stroke.  Heart attack.  Not being able to have children (infertility).  Weak bones (osteoporosis) and broken bones (fractures).  If you have coughing, wheezing, and shortness of breath, those symptoms may get better when you quit. You may also get sick less often. If you are pregnant, quitting smoking can help to lower your chances of having a baby of low birth weight. What can I do to help me quit smoking? Talk with your doctor about what can help you quit smoking. Some things you can do (strategies) include:  Quitting smoking totally, instead of slowly cutting back how much you smoke  over a period of time.  Going to in-person counseling. You are more likely to quit if you go to many counseling sessions.  Using resources and support systems, such as: ? Database administrator with a Social worker. ? Phone quitlines. ? Careers information officer. ? Support groups or group counseling. ? Text messaging programs. ? Mobile phone apps or applications.  Taking medicines. Some of these medicines may have nicotine in them. If you are pregnant or breastfeeding, do not take any  medicines to quit smoking unless your doctor says it is okay. Talk with your doctor about counseling or other things that can help you.  Talk with your doctor about using more than one strategy at the same time, such as taking medicines while you are also going to in-person counseling. This can help make quitting easier. What things can I do to make it easier to quit? Quitting smoking might feel very hard at first, but there is a lot that you can do to make it easier. Take these steps:  Talk to your family and friends. Ask them to support and encourage you.  Call phone quitlines, reach out to support groups, or work with a Social worker.  Ask people who smoke to not smoke around you.  Avoid places that make you want (trigger) to smoke, such as: ? Bars. ? Parties. ? Smoke-break areas at work.  Spend time with people who do not smoke.  Lower the stress in your life. Stress can make you want to smoke. Try these things to help your stress: ? Getting regular exercise. ? Deep-breathing exercises. ? Yoga. ? Meditating. ? Doing a body scan. To do this, close your eyes, focus on one area of your body at a time from head to toe, and notice which parts of your body are tense. Try to relax the muscles in those areas.  Download or buy apps on your mobile phone or tablet that can help you stick to your quit plan. There are many free apps, such as QuitGuide from the [employer] Office manager for Disease Control and Prevention). You can find more support from smokefree.gov and other websites.  This information is not intended to replace advice given to you by your health care provider. Make sure you discuss any questions you have with your health care provider. Document Released: 10/10/2009 Document Revised: 08/11/2016 Document Reviewed: 04/30/2015 Elsevier Interactive Patient Education  2018 [employer].

## 2018-10-17 ENCOUNTER — Other Ambulatory Visit: Payer: Self-pay | Admitting: Nurse Practitioner

## 2018-10-17 DIAGNOSIS — I10 Essential (primary) hypertension: Secondary | ICD-10-CM

## 2018-10-17 MED ORDER — AMLODIPINE BESYLATE 10 MG PO TABS: 10.0000 mg | ORAL_TABLET | Freq: Every day | ORAL | 1 refills | Status: DC

## 2018-10-17 NOTE — Telephone Encounter (Signed)
Copied from [place] [id number]. Topic: Quick Communication - Rx Refill/Question >> Oct 17, 2018  9:25 AM Celedonio Savage L wrote: Medication:  amLODipine (NORVASC) 10 MG tablet 90 day refill    Has the patient contacted their pharmacy? Yes I transferred pt to pharmacy (Agent: If no, request that the patient contact the pharmacy for the refill.) (Agent: If yes, when and what did the pharmacy advise?)  Preferred Pharmacy (with phone number or street name):     [facility] [phone] - [place], [address] AT [address] [place] [phone] (Phone) [phone] (Fax)    Agent: Please be advised that RX refills may take up to 3 business days. We ask that you follow-up with your pharmacy.

## 2019-03-10 ENCOUNTER — Other Ambulatory Visit: Payer: Self-pay

## 2019-03-10 ENCOUNTER — Ambulatory Visit: Payer: BLUE CROSS/BLUE SHIELD | Admitting: Family

## 2019-03-10 ENCOUNTER — Encounter: Payer: Self-pay | Admitting: Family

## 2019-03-10 VITALS — BP 106/88 | HR 96 | Temp 97.9°F | Ht 65.0 in | Wt 169.0 lb

## 2019-03-10 DIAGNOSIS — J209 Acute bronchitis, unspecified: Secondary | ICD-10-CM | POA: Diagnosis not present

## 2019-03-10 MED ORDER — PREDNISONE 20 MG PO TABS: 20.0000 mg | ORAL_TABLET | Freq: Every day | ORAL | 0 refills | Status: DC

## 2019-03-10 MED ORDER — DOXYCYCLINE HYCLATE 100 MG PO TABS: 100.0000 mg | ORAL_TABLET | Freq: Two times a day (BID) | ORAL | 0 refills | Status: DC

## 2019-03-10 MED ORDER — BENZONATATE 100 MG PO CAPS: 100.0000 mg | ORAL_CAPSULE | Freq: Three times a day (TID) | ORAL | 0 refills | Status: DC | PRN

## 2019-03-10 NOTE — Progress Notes (Signed)
Cynthia Ware is a 53 y.o. female with the following history as recorded in EpicCare:  Patient Active Problem List   Diagnosis Date Noted  . Cigarette nicotine dependence with nicotine-induced disorder 07/28/2018  . Carpal tunnel syndrome on both sides 07/11/2018  . 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 Pes planus 05/30/2018  . Encounter for general adult medical examination with abnormal findings 07/23/2017  . Lesion of breast 07/23/2017  . Essential hypertension 07/23/2017  . Boil, face 05/15/2016  . Routine general medical examination at a health care facility 06/03/2015  . Fibroids 04/27/2012  . Abnormal uterine bleeding 04/27/2012    Current Outpatient Medications  Medication Sig Dispense Refill  . amLODipine (NORVASC) 10 MG tablet Take 1 tablet (10 mg total) by mouth daily. 90 tablet 1  . benzonatate (TESSALON) 100 MG capsule Take 1 capsule (100 mg total) by mouth 3 (three) times daily as needed. 20 capsule 0  . Diclofenac Sodium (PENNSAID) 2 % SOLN Place 2 g onto the skin 2 (two) times daily. 112 g 3  . doxycycline (VIBRA-TABS) 100 MG tablet Take 1 tablet (100 mg total) by mouth 2 (two) times daily. 14 tablet 0  . predniSONE (DELTASONE) 20 MG tablet Take 1 tablet (20 mg total) by mouth daily with breakfast. 5 tablet 0   No current facility-administered medications for this visit.     Allergies: Ace inhibitors  Past Medical History:  Diagnosis Date  . Arthritis    Foot  . Depression   . Hypertension     Past Surgical History:  Procedure Laterality Date  . TUBAL LIGATION  2003    Family History  Problem Relation Age of Onset  . Hypertension Mother   . Cancer Father        prostate  . Hypertension Father   . Hypertension Sister   . Colon cancer Neg Hx   . Esophageal cancer Neg Hx   . Rectal cancer Neg Hx   . Stomach cancer Neg Hx   . Breast cancer Neg Hx     Social History   Tobacco Use  . Smoking status: Current Every Day Smoker   Packs/day: 0.50    Years: 34.00    Pack years: 17.00    Types: Cigarettes  . Smokeless tobacco: Never Used  Substance Use Topics  . Alcohol use: Yes    Alcohol/week: 0.0 standard drinks    Comment: socially    Subjective:  Patient presents with concerns for productive cough; prone to bronchitis when the weather changes; + smokes 1/2 ppd; typically gets this type of infection in the fall and spring; + sinus pain; occasional wheezing;      Objective:  Vitals:   03/10/19 1534  BP: 106/88  Pulse: 96  Temp: 97.9 F (36.6 C)  TempSrc: Oral  SpO2: 97%  Weight: 169 lb 0.6 oz (76.7 kg)  Height: 5\' 5"  (1.651 m)    General: Well developed, well nourished, in no acute distress  Skin : Warm and dry.  Head: Normocephalic and atraumatic  Eyes: Sclera and conjunctiva clear; pupils round and reactive to light; extraocular movements intact  Ears: External normal; canals clear; tympanic membranes normal  Oropharynx: Pink, supple. No suspicious lesions  Neck: Supple without thyromegaly, adenopathy  Lungs: Respirations unlabored; wheezing noted;  CVS exam: normal rate and regular rhythm.  Neurologic: Alert and oriented; speech intact; face symmetrical; moves all extremities well; CNII-XII intact without focal deficit   Assessment:  1. Acute bronchitis, unspecified organism  Plan:  Rx for Doxycycline 100 mg bid x 7 days, Prednisone 20 mg qd x 5 days, Tessalon Perles; increase fluids, rest and follow-up worse, no better.   No follow-ups on file.  No orders of the defined types were placed in this encounter.   Requested Prescriptions   Signed Prescriptions Disp Refills  . doxycycline (VIBRA-TABS) 100 MG tablet 14 tablet 0    Sig: Take 1 tablet (100 mg total) by mouth 2 (two) times daily.  . predniSONE (DELTASONE) 20 MG tablet 5 tablet 0    Sig: Take 1 tablet (20 mg total) by mouth daily with breakfast.  . benzonatate (TESSALON) 100 MG capsule 20 capsule 0    Sig: Take 1 capsule (100  mg total) by mouth 3 (three) times daily as needed.

## 2019-04-07 ENCOUNTER — Telehealth: Payer: Self-pay | Admitting: Nurse Practitioner

## 2019-04-07 DIAGNOSIS — I10 Essential (primary) hypertension: Secondary | ICD-10-CM

## 2019-04-07 NOTE — Telephone Encounter (Signed)
Copied from [place] [id number]. Topic: Quick Communication - Rx Refill/Question >> Apr 07, 2019  9:38 AM Reyne Dumas L wrote: Medication: amLODipine (NORVASC) 10 MG tablet  Has the patient contacted their pharmacy? Yes - states no refills left, pt states she only has 4 pills left. (Agent: If no, request that the patient contact the pharmacy for the refill.) (Agent: If yes, when and what did the pharmacy advise?)  Preferred Pharmacy (with phone number or street name): [facility] [phone] - [place], [place] [address] AT [facility] [address] [phone] (Phone) [phone] (Fax)  Agent: Please be advised that RX refills may take up to 3 business days. We ask that you follow-up with your pharmacy.

## 2019-04-10 MED ORDER — AMLODIPINE BESYLATE 10 MG PO TABS: 10.0000 mg | ORAL_TABLET | Freq: Every day | ORAL | 0 refills | Status: AC

## 2019-04-10 NOTE — Telephone Encounter (Addendum)
Ashleigh no longer here pt last saw Mickel Baas on 3/20. Sent refill to p[facility], also noted will need to f/u w/new provider for future refills.Marland KitchenJohny Chess

## 2019-04-12 ENCOUNTER — Encounter: Payer: Self-pay | Admitting: Family

## 2019-04-12 ENCOUNTER — Ambulatory Visit (INDEPENDENT_AMBULATORY_CARE_PROVIDER_SITE_OTHER): Payer: BLUE CROSS/BLUE SHIELD | Admitting: Family

## 2019-04-12 DIAGNOSIS — I10 Essential (primary) hypertension: Secondary | ICD-10-CM | POA: Diagnosis not present

## 2019-04-12 DIAGNOSIS — R0683 Snoring: Secondary | ICD-10-CM

## 2019-04-12 DIAGNOSIS — R29818 Other symptoms and signs involving the nervous system: Secondary | ICD-10-CM

## 2019-04-12 DIAGNOSIS — G478 Other sleep disorders: Secondary | ICD-10-CM | POA: Diagnosis not present

## 2019-04-12 NOTE — Progress Notes (Signed)
Cynthia Ware is a 53 y.o. female with the following history as recorded in EpicCare:  Patient Active Problem List   Diagnosis Date Noted  . Cigarette nicotine dependence with nicotine-induced disorder 07/28/2018  . Carpal tunnel syndrome on both sides 07/11/2018  . 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 Pes planus 05/30/2018  . Encounter for general adult medical examination with abnormal findings 07/23/2017  . Lesion of breast 07/23/2017  . Essential hypertension 07/23/2017  . Boil, face 05/15/2016  . Routine general medical examination at a health care facility 06/03/2015  . Fibroids 04/27/2012  . Abnormal uterine bleeding 04/27/2012    Current Outpatient Medications  Medication Sig Dispense Refill  . amLODipine (NORVASC) 10 MG tablet Take 1 tablet (10 mg total) by mouth daily. Must follow-up w/new provider for future refills 90 tablet 0  . Diclofenac Sodium (PENNSAID) 2 % SOLN Place 2 g onto the skin 2 (two) times daily. 112 g 3   No current facility-administered medications for this visit.     Allergies: Ace inhibitors  Past Medical History:  Diagnosis Date  . Arthritis    Foot  . Depression   . Hypertension     Past Surgical History:  Procedure Laterality Date  . TUBAL LIGATION  2003    Family History  Problem Relation Age of Onset  . Hypertension Mother   . Cancer Father        prostate  . Hypertension Father   . Hypertension Sister   . Colon cancer Neg Hx   . Esophageal cancer Neg Hx   . Rectal cancer Neg Hx   . Stomach cancer Neg Hx   . Breast cancer Neg Hx     Social History   Tobacco Use  . Smoking status: Current Every Day Smoker    Packs/day: 0.50    Years: 34.00    Pack years: 17.00    Types: Cigarettes  . Smokeless tobacco: Never Used  Substance Use Topics  . Alcohol use: Yes    Alcohol/week: 0.0 standard drinks    Comment: socially    Subjective:    I connected with Cynthia Ware on 04/12/19 at  9:40 AM EDT by a  video enabled telemedicine application and verified that I am speaking with the correct person using two identifiers. Patient and I are only 2 people on the video call.   I discussed the limitations of evaluation and management by telemedicine and the availability of in person appointments. The patient expressed understanding and agreed to proceed.  Patient presents with concerns for "not sleeping well for a while." Feels that she is not getting restful sleep- wakes up every 2-3 hours; does snore; has not ever been told that she stops breathing while sleeping;  Mentions that her mother does have sleep apnea; has tried OTC sleep medication with limited benefit; denies any concerns for anxiety that could be keeping her awake at night.  Did have new employee health assessment last week with [employer] health; per patient, blood pressure was normal and exam was unremarkable; did have to have Tdap, MMR and Hep B updated;   Objective:  There were no vitals filed for this visit.  General: Well developed, well nourished, in no acute distress  Skin : Warm and dry.  Head: Normocephalic and atraumatic  Lungs: Respirations unlabored; Neurologic: Alert and oriented; speech intact; face symmetrical; moves all extremities well;   Assessment:  1. Snoring   2. Poor sleep pattern   3. Suspected   sleep apnea   4. Essential hypertension     Plan:  Will refer to neurology for sleep study; patient in agreement; Patient will return to office in 4 months for blood pressure check/ yearly CPE; she prefers to call back and schedule at later date.    No follow-ups on file.  No orders of the defined types were placed in this encounter.   Requested Prescriptions    No prescriptions requested or ordered in this encounter     

## 2019-04-19 ENCOUNTER — Encounter: Payer: Self-pay | Admitting: Neurology

## 2019-04-19 ENCOUNTER — Telehealth: Payer: Self-pay | Admitting: Neurology

## 2019-04-19 NOTE — Addendum Note (Signed)
Addended by: Darleen Crocker on: 04/19/2019 05:02 PM   Modules accepted: Orders

## 2019-04-19 NOTE — Telephone Encounter (Signed)
Called the patient and reviewed her chart with her and made sure that everything was up to date. Informed her of the email that will be sent to have her complete and reply back to me prior to her apt. Pt verbalized understanding.

## 2019-04-19 NOTE — Telephone Encounter (Signed)
Due to current COVID 19 pandemic, our office is severely reducing in office visits, in order to minimize the risk to our patients and healthcare providers.  Pt understands that although there may be some limitations with this type of visit, we will take all precautions to reduce any security or privacy concerns.  Pt understands that this will be treated like an in office visit and we will file with pt's insurance, and there may be a patient responsible charge related to this service. Pt's email is [email or web address]. Pt understands that the cisco webex software must be downloaded and operational on the device pt plans to use for the visit. Pt understands that the nurse will be calling to go over pt's chart.

## 2019-04-26 ENCOUNTER — Other Ambulatory Visit: Payer: Self-pay

## 2019-04-26 ENCOUNTER — Encounter: Payer: Self-pay | Admitting: Neurology

## 2019-04-26 ENCOUNTER — Ambulatory Visit (INDEPENDENT_AMBULATORY_CARE_PROVIDER_SITE_OTHER): Payer: BLUE CROSS/BLUE SHIELD | Admitting: Neurology

## 2019-04-26 VITALS — BP 139/88 | HR 78 | Ht 65.0 in | Wt 177.0 lb

## 2019-04-26 DIAGNOSIS — I10 Essential (primary) hypertension: Secondary | ICD-10-CM | POA: Diagnosis not present

## 2019-04-26 DIAGNOSIS — F17219 Nicotine dependence, cigarettes, with unspecified nicotine-induced disorders: Secondary | ICD-10-CM | POA: Diagnosis not present

## 2019-04-26 DIAGNOSIS — N939 Abnormal uterine and vaginal bleeding, unspecified: Secondary | ICD-10-CM

## 2019-04-26 DIAGNOSIS — G4719 Other hypersomnia: Secondary | ICD-10-CM

## 2019-04-26 DIAGNOSIS — R0602 Shortness of breath: Secondary | ICD-10-CM

## 2019-04-26 DIAGNOSIS — F5104 Psychophysiologic insomnia: Secondary | ICD-10-CM

## 2019-04-26 DIAGNOSIS — R0683 Snoring: Secondary | ICD-10-CM

## 2019-04-26 NOTE — Progress Notes (Signed)
SLEEP MEDICINE CLINIC   Provider:  Larey Seat, [place] D  Primary Care Physician:  Lance Sell, NP   Referring Provider: Lance Sell, NP    Chief Complaint  Patient presents with   New Patient (Initial Visit)    pt presents today for sleep consult, pt states that she doesnt sleep well during the night. she wakes up frequently. never had a sleep study. pt states that snores.      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 The patient came in person to the office by [employer]BER, having had at least 3 conversations with my staff setting her up for a virtual visit.   Chief complaint according to patient : I am stressed and can't easily go to sleep, I go at least 3 times to urinate.   Medical history : HTN - controlled on 1 medication in AM.    Sleep medical history :  23 years ago she became a loud snorer, her sleep is fragmented and she dozes in daytime.   Family sleep history:  Mother has insomnia. Memory is good, father deceased at age 9 years of age, ex [place], in 47. Patient was 53 years old. Of Pneumonia, had agent orange exposure.      Social history: lives with a room mate, apartment share. She is on furlow. No visitors.  She is a smoker, actively consumes 1 ppd/ 2-3 days, drinks wine and beer, with dinner, every other day. Less than 10/ week.  Caffeine cut out sodas, rarely sweet tea ( when eating out , no coffee.    Sleep habits are as follows: Dinnertime for the patient is usually between 7 and 9 PM rather on the earlier side. She takes 2-3 ibuprofen  She goes to her bedroom between 930 and 10:30 PM, she watches TV in her bedroom, and she is unaware how long her sleep latency is.  She is sleeps rather dozes on and off and she states that she often worries or stresses about "crazy stuff". She rises around 630 and her breakfast consists of  cereal in the morning but now that she is not going to work she often goes back to bed and she may sleep yet another hour in the morning before lunchtime.  At this time she does not have an established exercise regimen but she has all intentions to stop walking.   Review of Systems: Out of a complete 14 system review, the patient complains of only the following symptoms, and all other reviewed systems are negative.  How likely are you to doze in the following situations: 0 = not likely, 1 = slight chance, 2 = moderate chance, 3 = high chance  Sitting and Reading? 2 Watching Television?2 Sitting inactive in a public place (theater or meeting)? 2 Lying down in the afternoon when circumstances permit?2  Sitting and talking to someone? 1 Sitting quietly after lunch without alcohol? 2  In a car, while stopped for a few minutes in traffic? 0 As a passenger in a car for an hour without a break? 3  Total =14/ 24    Neck circumference. 14.25 "    Social History   Socioeconomic History   Marital status: Single    Spouse name: Not on file   Number of children: 1   Years of education: 16   Highest  education level: Not on file  Occupational History   Occupation: Therapist, music strain: Not on file   Food insecurity:    Worry: Not on file    Inability: Not on file   Transportation needs:    Medical: Not on file    Non-medical: Not on file  Tobacco Use   Smoking status: Current Every Day Smoker    Packs/day: 0.50    Years: 34.00    Pack years: 17.00    Types: Cigarettes   Smokeless tobacco: Never Used  Substance and Sexual Activity   Alcohol use: Yes    Alcohol/week: 0.0 standard drinks    Comment: socially   Drug use: No   Sexual activity: Yes    Birth control/protection: Surgical    Comment: INTERCOURSE AGE 5, SEXUAL PARTNERS MORE THAN 3  Lifestyle   Physical activity:    Days per week: Not on file    Minutes per session:  Not on file   Stress: Not on file  Relationships   Social connections:    Talks on phone: Not on file    Gets together: Not on file    Attends religious service: Not on file    Active member of club or organization: Not on file    Attends meetings of clubs or organizations: Not on file    Relationship status: Not on file   Intimate partner violence:    Fear of current or ex partner: Not on file    Emotionally abused: Not on file    Physically abused: Not on file    Forced sexual activity: Not on file  Other Topics Concern   Not on file  Social History Narrative   Fun: Adventurous outdoor activities, TV (games shows, [place]), sports   Denies any religious beliefs effecting health care.     Family History  Problem Relation Age of Onset   Hypertension Mother    Cancer Father        prostate   Hypertension Father    Hypertension Sister    Colon cancer Neg Hx    Esophageal cancer Neg Hx    Rectal cancer Neg Hx    Stomach cancer Neg Hx    Breast cancer Neg Hx     Past Medical History:  Diagnosis Date   Arthritis    Foot   Depression    Hypertension     Past Surgical History:  Procedure Laterality Date   TUBAL LIGATION  2003    Current Outpatient Medications  Medication Sig Dispense Refill   amLODipine (NORVASC) 10 MG tablet Take 1 tablet (10 mg total) by mouth daily. Must follow-up w/new provider for future refills 90 tablet 0   Ascorbic Acid (VITAMIN C) 1000 MG tablet Take 2,000 mg by mouth daily.     ferrous sulfate 325 (65 FE) MG EC tablet Take 325 mg by mouth every other day.     POTASSIUM BICARBONATE PO Take by mouth.     No current facility-administered medications for this visit.     Allergies as of 04/26/2019 - Review Complete 04/26/2019  Allergen Reaction Noted   Ace inhibitors Swelling 01/28/2018    Vitals: BP 139/88    Pulse 78    Ht 5\' 5"  (1.651 m)    Wt 177 lb (80.3 kg)    LMP 03/09/2012    BMI 29.45 kg/m  Last Weight:    Wt Readings from Last 1 Encounters:  04/26/19 177 lb (  80.3 kg)   IRS:WNIO mass index is 29.45 kg/m.     Last Height:   Ht Readings from Last 1 Encounters:  04/26/19 5\' 5"  (1.651 m)    Physical exam:  General: The patient is awake, alert and appears not in acute distress.  The patient is well groomed. Head: Normocephalic, atraumatic. Neck is supple. Mallampati 5, narrow ,  neck circumference:14.4 .  Nasal airflow congestion-  She could only hold her breath for 14 seconds- restricted.  No wheezing.   Retrognathia is seen.  Had braces, no tooth grinding. ROM  without distended neck veins. Respiratory: baseline res rate 186 at rest . Skin: left ankle edema, or rash Trunk: BMI is 29. The patient's posture is erect.    Neurologic exam : The patient is awake and alert, oriented to place and time.   Memory subjective described as intact.   Attention span & concentration ability appears normal.  Speech is fluent,  without  dysarthria, dysphonia or aphasia.  Mood and affect are appropriate.  Cranial nerves: Pupils are equal and briskly reactive to light. Funduscopic exam without evidence of pallor or edema. Extraocular movements  in vertical and horizontal planes intact and without nystagmus. Visual fields by finger perimetry are intact. Hearing to finger rub is impaired- she also reports tinnitus. .   Facial sensation intact to fine touch.  Facial motor strength is symmetric and tongue and uvula move midline. Shoulder shrug was symmetrical.   Motor exam: Normal tone, muscle bulk and symmetric strength in all extremities.  Sensory:  Fine touch, pinprick and vibration were tested in all extremities. Proprioception tested in the upper extremities was normal.  Coordination: Rapid alternating movements in the fingers/hands  were slowed. Finger-to-nose maneuver  normal without evidence of ataxia, dysmetria or tremor.  Gait and station: Patient walks without assistive device and is able  unassisted to climb up to the exam table. Strength within normal limits.  Stance is stable and normal.  Toe and hell stand were tested .Tandem gait is unfragmented. Turns with  3  Steps. Romberg testing is  negative.  Deep tendon reflexes: in the  upper and lower extremities are symmetric and intact. Babinski maneuver response is  downgoing.    Assessment:  After physical and neurologic examination, review of laboratory studies,  Personal review of imaging studies, reports of other /same  Imaging studies, results of polysomnography and / or neurophysiology testing and pre-existing records as far as provided in visit., my assessment is   1) restricted respiratory capacity, tachypnoea and nicotine use.   2) snoring   3) EDS- excessive daytime sleepiness.   4) insomnia- sleep hygiene is poor.   5 history of Anemia, and menopausal hot flashes, night time sweating.   6) smoking cessation- help your lungs.     The patient was advised of the nature of the diagnosed disorder , the treatment options and the  risks for general health and wellness arising from not treating the condition.   I spent more than 70minutes of face to face time with the patient.  Greater than 50% of time was spent in counseling and coordination of care. We have discussed the diagnosis and differential and I answered the patient's questions.    Plan:  Treatment plan and additional workup : Sleep hygiene boot camp, 14 day plan.   HST for you.  OSA may explain fragmented sleep, nocturia and daytime fatigue.  Try low dose benadryl for children, as a sleep aid.  Cut off  of the TV- and keep the bedroom cool, quiet and dark.  Turn the clock away or cover it_    Larey Seat, MD 9/35/7017, 7:93 AM  Certified in Neurology by ABPN Certified in [place] by [facility] [address], [place], [place] [address]

## 2019-04-26 NOTE — Patient Instructions (Signed)
Please remember to try to maintain good sleep hygiene, which means: Keep a regular sleep and wake schedule, try not to exercise or have a meal within 2 hours of your bedtime, try to keep your bedroom conducive for sleep, that is, cool and dark, without light distractors such as an illuminated alarm clock, and refrain from watching TV right before sleep or in the middle of the night and do not keep the TV or radio on during the night. Also, try not to use or play on electronic devices at bedtime, such as your cell phone, tablet PC or laptop. If you like to read at bedtime on an electronic device, try to dim the background light as much as possible. Do not eat in the middle of the night.   We will request a sleep study.    We will look for  snoring and sleep apnea, low oxygen  Steps to Quit Smoking  Smoking tobacco can be bad for your health. It can also affect almost every organ in your body. Smoking puts you and people around you at risk for many serious long-lasting (chronic) diseases. Quitting smoking is hard, but it is one of the best things that you can do for your health. It is never too late to quit. What are the benefits of quitting smoking? When you quit smoking, you lower your risk for getting serious diseases and conditions. They can include: Lung cancer or lung disease. Heart disease. Stroke. Heart attack. Not being able to have children (infertility). Weak bones (osteoporosis) and broken bones (fractures). If you have coughing, wheezing, and shortness of breath, those symptoms may get better when you quit. You may also get sick less often. If you are pregnant, quitting smoking can help to lower your chances of having a baby of low birth weight. What can I do to help me quit smoking? Talk with your doctor about what can help you quit smoking. Some things you can do (strategies) include: Quitting smoking totally, instead of slowly cutting back how much you smoke over a period of time.  Going to in-person counseling. You are more likely to quit if you go to many counseling sessions. Using resources and support systems, such as: Online chats with a Social worker. Phone quitlines. Printed Furniture conservator/restorer. Support groups or group counseling. Text messaging programs. Mobile phone apps or applications. Taking medicines. Some of these medicines may have nicotine in them. If you are pregnant or breastfeeding, do not take any medicines to quit smoking unless your doctor says it is okay. Talk with your doctor about counseling or other things that can help you. Talk with your doctor about using more than one strategy at the same time, such as taking medicines while you are also going to in-person counseling. This can help make quitting easier. What things can I do to make it easier to quit? Quitting smoking might feel very hard at first, but there is a lot that you can do to make it easier. Take these steps: Talk to your family and friends. Ask them to support and encourage you. Call phone quitlines, reach out to support groups, or work with a Social worker. Ask people who smoke to not smoke around you. Avoid places that make you want (trigger) to smoke, such as: Bars. Parties. Smoke-break areas at work. Spend time with people who do not smoke. Lower the stress in your life. Stress can make you want to smoke. Try these things to help your stress: Getting regular exercise. Deep-breathing exercises.  Yoga. Meditating. Doing a body scan. To do this, close your eyes, focus on one area of your body at a time from head to toe, and notice which parts of your body are tense. Try to relax the muscles in those areas. Download or buy apps on your mobile phone or tablet that can help you stick to your quit plan. There are many free apps, such as QuitGuide from the [employer] Office manager for Disease Control and Prevention). You can find more support from smokefree.gov and other websites. This information is  not intended to replace advice given to you by your health care provider. Make sure you discuss any questions you have with your health care provider. Document Released: 10/10/2009 Document Revised: 08/11/2016 Document Reviewed: 04/30/2015 Elsevier Interactive Patient Education  2019 [employer]. .  ,  For chronic insomnia, you are best followed by a psychiatrist and/or sleep psychologist.   We will call you with the sleep study results and make a follow up appointment if needed.      Insomnia Insomnia is a sleep disorder that makes it difficult to fall asleep or stay asleep. Insomnia can cause fatigue, low energy, difficulty concentrating, mood swings, and poor performance at work or school. There are three different ways to classify insomnia:  Difficulty falling asleep.  Difficulty staying asleep.  Waking up too early in the morning. Any type of insomnia can be long-term (chronic) or short-term (acute). Both are common. Short-term insomnia usually lasts for three months or less. Chronic insomnia occurs at least three times a week for longer than three months. What are the causes? Insomnia may be caused by another condition, situation, or substance, such as:  Anxiety.  Certain medicines.  Gastroesophageal reflux disease (GERD) or other gastrointestinal conditions.  Asthma or other breathing conditions.  Restless legs syndrome, sleep apnea, or other sleep disorders.  Chronic pain.  Menopause.  Stroke.  Abuse of alcohol, tobacco, or illegal drugs.  Mental health conditions, such as depression.  Caffeine.  Neurological disorders, such as Alzheimer's disease.  An overactive thyroid (hyperthyroidism). Sometimes, the cause of insomnia may not be known. What increases the risk? Risk factors for insomnia include:  Gender. Women are affected more often than men.  Age. Insomnia is more common as you get older.  Stress.  Lack of exercise.  Irregular work schedule  or working night shifts.  Traveling between different time zones.  Certain medical and mental health conditions. What are the signs or symptoms? If you have insomnia, the main symptom is having trouble falling asleep or having trouble staying asleep. This may lead to other symptoms, such as:  Feeling fatigued or having low energy.  Feeling nervous about going to sleep.  Not feeling rested in the morning.  Having trouble concentrating.  Feeling irritable, anxious, or depressed. How is this diagnosed? This condition may be diagnosed based on:  Your symptoms and medical history. Your health care provider may ask about: ? Your sleep habits. ? Any medical conditions you have. ? Your mental health.  A physical exam. How is this treated? Treatment for insomnia depends on the cause. Treatment may focus on treating an underlying condition that is causing insomnia. Treatment may also include:  Medicines to help you sleep.  Counseling or therapy.  Lifestyle adjustments to help you sleep better. Follow these instructions at home: Eating and drinking   Limit or avoid alcohol, caffeinated beverages, and cigarettes, especially close to bedtime. These can disrupt your sleep.  Do not eat a large  meal or eat spicy foods right before bedtime. This can lead to digestive discomfort that can make it hard for you to sleep. Sleep habits   Keep a sleep diary to help you and your health care provider figure out what could be causing your insomnia. Write down: ? When you sleep. ? When you wake up during the night. ? How well you sleep. ? How rested you feel the next day. ? Any side effects of medicines you are taking. ? What you eat and drink.  Make your bedroom a dark, comfortable place where it is easy to fall asleep. ? Put up shades or blackout curtains to block light from outside. ? Use a white noise machine to block noise. ? Keep the temperature cool.  Limit screen use before  bedtime. This includes: ? Watching TV. ? Using your smartphone, tablet, or computer.  Stick to a routine that includes going to bed and waking up at the same times every day and night. This can help you fall asleep faster. Consider making a quiet activity, such as reading, part of your nighttime routine.  Try to avoid taking naps during the day so that you sleep better at night.  Get out of bed if you are still awake after 15 minutes of trying to sleep. Keep the lights down, but try reading or doing a quiet activity. When you feel sleepy, go back to bed. General instructions  Take over-the-counter and prescription medicines only as told by your health care provider.  Exercise regularly, as told by your health care provider. Avoid exercise starting several hours before bedtime.  Use relaxation techniques to manage stress. Ask your health care provider to suggest some techniques that may work well for you. These may include: ? Breathing exercises. ? Routines to release muscle tension. ? Visualizing peaceful scenes.  Make sure that you drive carefully. Avoid driving if you feel very sleepy.  Keep all follow-up visits as told by your health care provider. This is important. Contact a health care provider if:  You are tired throughout the day.  You have trouble in your daily routine due to sleepiness.  You continue to have sleep problems, or your sleep problems get worse. Get help right away if:  You have serious thoughts about hurting yourself or someone else. If you ever feel like you may hurt yourself or others, or have thoughts about taking your own life, get help right away. You can go to your nearest emergency department or call:  Your local emergency services (911 in the [place]  A suicide crisis helpline, such as the [facility] at (434)314-2276. This is open 24 hours a day. Summary  Insomnia is a sleep disorder that makes it difficult to fall  asleep or stay asleep.  Insomnia can be long-term (chronic) or short-term (acute).  Treatment for insomnia depends on the cause. Treatment may focus on treating an underlying condition that is causing insomnia.  Keep a sleep diary to help you and your health care provider figure out what could be causing your insomnia. This information is not intended to replace advice given to you by your health care provider. Make sure you discuss any questions you have with your health care provider. Document Released: 12/11/2000 Document Revised: 09/23/2017 Document Reviewed: 09/23/2017 Elsevier Interactive Patient Education  2019 [employer].

## 2019-06-19 ENCOUNTER — Ambulatory Visit (INDEPENDENT_AMBULATORY_CARE_PROVIDER_SITE_OTHER): Payer: BLUE CROSS/BLUE SHIELD | Admitting: Neurology

## 2019-06-19 DIAGNOSIS — F5104 Psychophysiologic insomnia: Secondary | ICD-10-CM

## 2019-06-19 DIAGNOSIS — R0683 Snoring: Secondary | ICD-10-CM

## 2019-06-19 DIAGNOSIS — F17219 Nicotine dependence, cigarettes, with unspecified nicotine-induced disorders: Secondary | ICD-10-CM

## 2019-06-19 DIAGNOSIS — D500 Iron deficiency anemia secondary to blood loss (chronic): Secondary | ICD-10-CM

## 2019-06-19 DIAGNOSIS — N939 Abnormal uterine and vaginal bleeding, unspecified: Secondary | ICD-10-CM

## 2019-06-19 DIAGNOSIS — G4719 Other hypersomnia: Secondary | ICD-10-CM

## 2019-06-19 DIAGNOSIS — G4733 Obstructive sleep apnea (adult) (pediatric): Secondary | ICD-10-CM

## 2019-06-19 DIAGNOSIS — I10 Essential (primary) hypertension: Secondary | ICD-10-CM

## 2019-06-19 DIAGNOSIS — R0602 Shortness of breath: Secondary | ICD-10-CM

## 2019-06-21 NOTE — Progress Notes (Signed)
[facility] [address] [place], [place] [address] Phone: [phone] Subjective:   Fontaine No, am serving as a scribe for Dr. Hulan Saas.     CC: Bilateral ankle pain  Cynthia Ware:[id number]   7/115/2019: Patient given injection.  Discussed icing regimen, topical anti-inflammatories, proper shoes.  Encourage patient to home exercise.  Follow-up again in 4 weeks  Update 06/22/2019: Cynthia Ware is a 53 y.o. female coming in with complaint of foot and left wrist/thumb pain. Pain is constant in her wrist. Throbbing of thenar eminence.  Patient points to the [facility] joint.  Making it difficult to do daily activities.  Patient states her grip seems to be weakened.  Denies any neck pain associated with it.  Symptoms can be a sharp pain with certain movements  Continues to have bilateral posterior tib pain. Swelling noted over ankles. Use IBU at night due to throbbing.  Patient has posterior tibialis insufficiency bilaterally.  Has not been wearing the good shoes as much.  States it is severe.  Cannot walk greater than 50 feet without increasing discomfort and pain.  In the morning it is unrelenting she states.      Past Medical History:  Diagnosis Date   Arthritis    Foot   Depression    Hypertension    Past Surgical History:  Procedure Laterality Date   TUBAL LIGATION  2003   Social History   Socioeconomic History   Marital status: Single    Spouse name: Not on file   Number of children: 1   Years of education: 16   Highest education level: Not on file  Occupational History   Occupation: Therapist, music strain: Not on file   Food insecurity    Worry: Not on file    Inability: Not on file   Transportation needs    Medical: Not on file    Non-medical: Not on file  Tobacco Use   Smoking status: Current Every Day Smoker    Packs/day: 0.50    Years: 34.00    Pack years: 17.00    Types: Cigarettes    Smokeless tobacco: Never Used  Substance and Sexual Activity   Alcohol use: Yes    Alcohol/week: 0.0 standard drinks    Comment: socially   Drug use: No   Sexual activity: Yes    Birth control/protection: Surgical    Comment: INTERCOURSE AGE 29, SEXUAL PARTNERS MORE THAN 5  Lifestyle   Physical activity    Days per week: Not on file    Minutes per session: Not on file   Stress: Not on file  Relationships   Social connections    Talks on phone: Not on file    Gets together: Not on file    Attends religious service: Not on file    Active member of club or organization: Not on file    Attends meetings of clubs or organizations: Not on file    Relationship status: Not on file  Other Topics Concern   Not on file  Social History Narrative   Fun: Adventurous outdoor activities, TV (games shows, [place]), sports   Denies any religious beliefs effecting health care.    Allergies  Allergen Reactions   Ace Inhibitors Swelling   Family History  Problem Relation Age of Onset   Hypertension Mother    Cancer Father        prostate   Hypertension Father  Hypertension Sister    Colon cancer Neg Hx    Esophageal cancer Neg Hx    Rectal cancer Neg Hx    Stomach cancer Neg Hx    Breast cancer Neg Hx      Current Outpatient Medications (Cardiovascular):    amLODipine (NORVASC) 10 MG tablet, Take 1 tablet (10 mg total) by mouth daily. Must follow-up w/new provider for future refills    Current Outpatient Medications (Hematological):    ferrous sulfate 325 (65 FE) MG EC tablet, Take 325 mg by mouth every other day.  Current Outpatient Medications (Other):    Ascorbic Acid (VITAMIN C) 1000 MG tablet, Take 2,000 mg by mouth daily.   POTASSIUM BICARBONATE PO, Take by mouth.    Past medical history, social, surgical and family history all reviewed in electronic medical record.  No pertanent information unless stated regarding to the chief complaint.      Review of Systems:  No headache, visual changes, nausea, vomiting, diarrhea, constipation, dizziness, abdominal pain, skin rash, fevers, chills, night sweats, weight loss, swollen lymph nodes,  chest pain, shortness of breath, mood changes.  Positive muscle aches and joint swelling  Objective  Blood pressure 130/80, pulse 90, height 5\' 5"  (1.651 m), weight 153 lb (69.4 kg), last menstrual period 03/09/2012, SpO2 97 %.    General: No apparent distress alert and oriented x3 mood and affect normal, dressed appropriately.  HEENT: Pupils equal, extraocular movements intact  Respiratory: Patient's speak in full sentences and does not appear short of breath  Cardiovascular: No lower extremity edema, non tender, no erythema  Skin: Warm dry intact with no signs of infection or rash on extremities or on axial skeleton.  Abdomen: Soft nontender  Neuro: Cranial nerves II through XII are intact, neurovascularly intact in all extremities with 2+ DTRs and 2+ pulses.  Lymph: No lymphadenopathy of posterior or anterior cervical chain or axillae bilaterally.  Gait normal with good balance and coordination.  MSK:  Non tender with full range of motion and good stability and symmetric strength and tone of shoulders, elbows, wrist, hip, knee bilaterally.  Ankle:  Bilateral ankle exam shows the patient does have severe pes planus with overpronation of the hindfoot.  When patient goes up on her toes unfortunately no supination occurs.  Severe tenderness to palpation over the posterior tibialis area bilaterally.  Neurovascularly intact distally.  No pain over the navicular bones.  Negative Thompson.  Patient's left wrist shows the patient does have some mild swelling of the CMC joint.  Positive grind test.  5 out of 5 strength of the grip strength.  Neurovascular intact in all fingers.  Procedure: Real-time Ultrasound Guided Injection of left CMC joint Device: GE Logiq Q7 Ultrasound guided injection is preferred  based studies that show increased duration, increased effect, greater accuracy, decreased procedural pain, increased response rate, and decreased cost with ultrasound guided versus blind injection.  Verbal informed consent obtained.  Time-out conducted.  Noted no overlying erythema, induration, or other signs of local infection.  Skin prepped in a sterile fashion.  Local anesthesia: Topical Ethyl chloride.  With sterile technique and under real time ultrasound guidance: With a 25-gauge half inch needle injected with 0.5 cc of 0.5% Marcaine and 0.5 cc of Kenalog 40 mg/mL Completed without difficulty  Pain immediately resolved suggesting accurate placement of the medication.  Advised to call if fevers/chills, erythema, induration, drainage, or persistent bleeding.  Images permanently stored and available for review in the ultrasound unit.  Impression: Technically  successful ultrasound guided injection.  Procedure: Real-time Ultrasound Guided Injection of right posterior tibialis tendon sheath Device: GE Logiq Q7 Ultrasound guided injection is preferred based studies that show increased duration, increased effect, greater accuracy, decreased procedural pain, increased response rate, and decreased cost with ultrasound guided versus blind injection.  Verbal informed consent obtained.  Time-out conducted.  Noted no overlying erythema, induration, or other signs of local infection.  Skin prepped in a sterile fashion.  Local anesthesia: Topical Ethyl chloride.  With sterile technique and under real time ultrasound guidance: With a 25-gauge half inch needle was injected with 0.5 cc of 0.5% Marcaine and 0.5 cc of Kenalog 40 mg/mL into the tendon sheath Completed without difficulty  Pain immediately resolved suggesting accurate placement of the medication.  Advised to call if fevers/chills, erythema, induration, drainage, or persistent bleeding.  Images permanently stored and available for review in  the ultrasound unit.  Impression: Technically successful ultrasound guided injection.  Procedure: Real-time Ultrasound Guided Injection of left posterior tibialis tendon sheath Device: GE Logiq Q7 Ultrasound guided injection is preferred based studies that show increased duration, increased effect, greater accuracy, decreased procedural pain, increased response rate, and decreased cost with ultrasound guided versus blind injection.  Verbal informed consent obtained.  Time-out conducted.  Noted no overlying erythema, induration, or other signs of local infection.  Skin prepped in a sterile fashion.  Local anesthesia: Topical Ethyl chloride.  With sterile technique and under real time ultrasound guidance: With a 25-gauge half inch needle injected with 0.5 cc of 0.5% Marcaine and 0.5 cc of Kenalog 40 mg/mL into the tendon sheath Completed without difficulty  Pain immediately resolved suggesting accurate placement of the medication.  Advised to call if fevers/chills, erythema, induration, drainage, or persistent bleeding.  Images permanently stored and available for review in the ultrasound unit.  Impression: Technically successful ultrasound guided injection.    Impression and Recommendations:     This case required medical decision making of moderate complexity. The above documentation has been reviewed and is accurate and complete Lyndal Pulley, DO       Note: This dictation was prepared with Dragon dictation along with smaller phrase technology. Any transcriptional errors that result from this process are unintentional.

## 2019-06-22 ENCOUNTER — Ambulatory Visit: Payer: BLUE CROSS/BLUE SHIELD | Admitting: Family Medicine

## 2019-06-22 ENCOUNTER — Encounter: Payer: Self-pay | Admitting: Family Medicine

## 2019-06-22 ENCOUNTER — Ambulatory Visit: Payer: Self-pay

## 2019-06-22 ENCOUNTER — Other Ambulatory Visit: Payer: Self-pay

## 2019-06-22 VITALS — BP 130/80 | HR 90 | Ht 65.0 in | Wt 153.0 lb

## 2019-06-22 DIAGNOSIS — M76822 Posterior tibial tendinitis, left leg: Secondary | ICD-10-CM

## 2019-06-22 DIAGNOSIS — R600 Localized edema: Secondary | ICD-10-CM | POA: Insufficient documentation

## 2019-06-22 DIAGNOSIS — M79672 Pain in left foot: Secondary | ICD-10-CM

## 2019-06-22 DIAGNOSIS — M79671 Pain in right foot: Secondary | ICD-10-CM

## 2019-06-22 DIAGNOSIS — M76829 Posterior tibial tendinitis, unspecified leg: Secondary | ICD-10-CM

## 2019-06-22 DIAGNOSIS — R609 Edema, unspecified: Secondary | ICD-10-CM | POA: Diagnosis not present

## 2019-06-22 DIAGNOSIS — M76821 Posterior tibial tendinitis, right leg: Secondary | ICD-10-CM

## 2019-06-22 DIAGNOSIS — M1812 Unilateral primary osteoarthritis of first carpometacarpal joint, left hand: Secondary | ICD-10-CM | POA: Diagnosis not present

## 2019-06-22 NOTE — Assessment & Plan Note (Signed)
Bilateral injections given today.  Tolerated the procedure well.  We discussed with patient about the peripheral edema that is likely also contributing.  Could be a potential side effect of the amlodipine.  Encourage patient to monitor blood pressure as well as trying half the amlodipine to see how she responds.  Discussed compression socks as well.  Discussed proper shoes with longitudinal arch support for the posterior tibialis.  Follow-up again in 4 to 6 weeks

## 2019-06-22 NOTE — Assessment & Plan Note (Signed)
Patient was given injection today.  Tolerated the procedure well.  We discussed icing regimen and home exercises.  Discussed which activities of doing which wants to avoid.  Patient is to increase activity slowly.  We discussed bracing which patient declined.  I do think underlying gout could be also contributing.  Patient wants to hold on any testing at this point.  Discussed over-the-counter medications.  Follow-up again in 4 to 6 weeks

## 2019-06-22 NOTE — Patient Instructions (Addendum)
Injected both ankles Injected Left wrist Brace at night for wrist 1/2 amoldopine for 1 week Compression socks Tart cherry 1200mg  at night See me again for follow up in 6 weeks

## 2019-06-22 NOTE — Assessment & Plan Note (Signed)
Possibilities secondary to dependent edema versus pneumonia.  Side effects.  Compression socks.  No shortness of breath at this point

## 2019-06-23 ENCOUNTER — Encounter: Payer: Self-pay | Admitting: Family Medicine

## 2019-06-28 NOTE — Progress Notes (Signed)
Patient Information     First Name: Cynthia Ware Last Name: Mccarey ID: [id number]  Birth Date: 1966-10-17 Age: 53 Gender: Female  Referring Provider: Caesar Chestnut, NP BMI: 29.4 (W=176 lb, H=5' 5'')  Neck Circ.:  14 Epworth:  14  Sleep Study Information    Study Date: Jun 19, 2019 S/H/A Version: 001.001.001.001 / 4.1.1528 / 69  History      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 The patient came in person: reporting her chief complaint: ". I am stressed and can't easily go to sleep, I go at least 3 times to urinate."  Medical history: HTN - controlled on 1 medication in AM.  Sleep medical history:  About 23 years ago she became a loud snorer, her sleep is fragmented and she dozes in daytime, is excessively daytime sleepy.   Summary & Diagnosis:    Severe Apnea by AHI 65.1/h. with periodic hypoxia.    Recommendations:      This degree of sleep apnea and hypoxia is best treated by CPAP. I like for the patient to try auto CPAP I or return for an attended sleep study with CPAP titration.    Larey Seat, MD   06-27-2019              Sleep Summary    Oxygen Saturation Statistics     Start Study Time: End Study Time: Total Recording Time:  8:16:41 PM 3:33:20 AM 7 hrs, 16 min  Total Sleep Time % REM of Sleep Time:  5 hrs, 4 min 21.0    Mean: 92 Minimum: 78 Maximum: 98  Mean of Desaturations Nadirs (%):   90  Oxygen Desaturation. %:   4-9 10-20 >20 Total  Events Number Total   149  5 96.8 3.2  0 0.0  154 100.0  Oxygen Saturation: <90 <=88 <85 <80 <70  Duration (minutes): Sleep % 14.8 4.9  6.1 1.2  2.0 0.4 0.1 0.0 0.0 0.0     Respiratory Indices      Total Events REM NREM All Night  pRDI:  224  pAHI:  224 ODI:  154  pAHIc:  10  % CSR: 0.0 74.0 74.0 64.8 4.6 64.5 64.5 43.4 2.8 65.1 65.1 44.7 2.9       Pulse Rate Statistics during  Sleep (BPM)      Mean: 82 Minimum: 56 Maximum: 112    Indices are calculated using technically valid sleep time of  3 hrs, 26 min. pRDI/pAHI are calculated using oxi desaturations ? 3%  Body Position Statistics  Position Supine Prone Right Left Non-Supine  Sleep (min) 171.1 0.0 65.5 68.0 133.5  Sleep % 56.2 0.0 21.5 22.3 43.8  pRDI 71.8 N/A 49.1 68.1 56.9  pAHI 71.8 N/A 49.1 68.1 56.9  ODI 55.9 N/A 28.4 34.9 31.0     Snoring Statistics Snoring Level (dB) >40 >50 >60 >70 >80 >Threshold (45)  Sleep (min) 283.0 115.0 20.2 0.0 0.0 199.8  Sleep % 92.9 37.7 6.6 0.0 0.0 65.6    Mean: 49 dB Sleep Stages Chart  pAHI=65.1                                                                                              Mild              Moderate                    Severe                                                 5              15

## 2019-06-28 NOTE — Procedures (Signed)
Patient Information     First Name: Cynthia Last Name: Ware ID: [id number]  Birth Date: [birth year] Age: 53 Gender: Female  Referring Provider: Caesar Chestnut, NP BMI: 29.4 (W=176 lb, H=5' 5'')  Neck Circ.:  14 Epworth:  14  Sleep Study Information    Study Date: Jun 19, 2019 S/H/A Version: [id number] / 4.1.1528 / 56  History      Mrs. Cynthia Ware is a 53 y.o. 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 The patient came in person: reporting her chief complaint: ". I am stressed and can't easily go to sleep, I go at least 3 times to urinate."  Medical history: HTN - controlled on 1 medication in AM.  Sleep medical history:  About 23 years ago she became a loud snorer, her sleep is fragmented and she dozes in daytime, is excessively daytime sleepy.   Summary & Diagnosis:    Severe Apnea by AHI 65.1/h. with periodic hypoxia.    Recommendations:      This degree of sleep apnea and hypoxia is best treated by CPAP. I like for the patient to try auto CPAP I or return for an attended sleep study with CPAP titration.    Larey Seat, MD   06-27-2019              Sleep Summary    Oxygen Saturation Statistics     Start Study Time: End Study Time: Total Recording Time:  8:16:41 PM 3:33:20 AM 7 hrs, 16 min  Total Sleep Time % REM of Sleep Time:  5 hrs, 4 min 21.0    Mean: 92 Minimum: 78 Maximum: 98  Mean of Desaturations Nadirs (%):   90  Oxygen Desaturation. %:   4-9 10-20 >20 Total  Events Number Total   149  5 96.8 3.2  0 0.0  154 100.0  Oxygen Saturation: <90 <=88 <85 <80 <70  Duration (minutes): Sleep % 14.8 4.9  6.1 1.2  2.0 0.4 0.1 0.0 0.0 0.0     Respiratory Indices      Total Events REM NREM All Night  pRDI:  224  pAHI:  224 ODI:  154  pAHIc:  10  % CSR: 0.0 74.0 74.0 64.8 4.6 64.5 64.5 43.4 2.8 65.1 65.1 44.7 2.9       Pulse Rate Statistics during  Sleep (BPM)      Mean: 82 Minimum: 56 Maximum: 112    Indices are calculated using technically valid sleep time of  3 hrs, 26 min. pRDI/pAHI are calculated using oxi desaturations ? 3%  Body Position Statistics  Position Supine Prone Right Left Non-Supine  Sleep (min) 171.1 0.0 65.5 68.0 133.5  Sleep % 56.2 0.0 21.5 22.3 43.8  pRDI 71.8 N/A 49.1 68.1 56.9  pAHI 71.8 N/A 49.1 68.1 56.9  ODI 55.9 N/A 28.4 34.9 31.0     Snoring Statistics Snoring Level (dB) >40 >50 >60 >70 >80 >Threshold (45)  Sleep (min) 283.0 115.0 20.2 0.0 0.0 199.8  Sleep % 92.9 37.7 6.6 0.0 0.0 65.6    Mean: 49 dB Sleep Stages Chart  pAHI=65.1                                                                                              Mild              Moderate                    Severe                                                 5              15

## 2019-06-28 NOTE — Addendum Note (Signed)
Addended by: Larey Seat on: 06/28/2019 04:18 PM   Modules accepted: Orders

## 2019-06-29 ENCOUNTER — Telehealth: Payer: Self-pay | Admitting: Neurology

## 2019-06-29 ENCOUNTER — Encounter: Payer: Self-pay | Admitting: Neurology

## 2019-06-29 ENCOUNTER — Other Ambulatory Visit: Payer: Self-pay | Admitting: Neurology

## 2019-06-29 DIAGNOSIS — F5104 Psychophysiologic insomnia: Secondary | ICD-10-CM

## 2019-06-29 DIAGNOSIS — G4719 Other hypersomnia: Secondary | ICD-10-CM

## 2019-06-29 DIAGNOSIS — R0683 Snoring: Secondary | ICD-10-CM

## 2019-06-29 NOTE — Telephone Encounter (Signed)
error 

## 2019-06-29 NOTE — Telephone Encounter (Signed)
-----   Message from Larey Seat, MD sent at 06/28/2019  4:18 PM EDT ----- This degree of sleep apnea and hypoxia is best treated by CPAP.  The patient has Severe Obstructive Sleep apnea, very loud snoring and hypoxia.   I like for the patient to try auto CPAP or to return for an attended sleep study with CPAP titration.    Larey Seat, MD   06-27-2019    Cc PCP, please- result cannot be send on epic.

## 2019-06-29 NOTE — Telephone Encounter (Signed)
I called pt. I advised pt that Dr. Brett Fairy reviewed their sleep study results and found that pt has severe sleep apnea. Dr. Brett Fairy recommends that pt starts CPAP. I reviewed PAP compliance expectations with the pt. Pt is agreeable to starting a CPAP. I advised pt that an order will be sent to a DME, [facility], and [facility] will call the pt within about one week after they file with the pt's insurance. [facility] will show the pt how to use the machine, fit for masks, and troubleshoot the CPAP if needed. A follow up appt was made for insurance purposes with Ward Givens, NP on Sept 10,2020 at 10:30 am. Pt verbalized understanding to arrive 15 minutes early and bring their CPAP. A letter with all of this information in it will be mailed to the pt as a reminder. I verified with the pt that the address we have on file is correct. Pt verbalized understanding of results. Pt had no questions at this time but was encouraged to call back if questions arise. I have sent the order to [facility] and have received confirmation that they have received the order.

## 2019-06-29 NOTE — Telephone Encounter (Deleted)
-----   Message from Larey Seat, MD sent at 06/28/2019  4:38 PM EDT -----     Referring Provider: Brien Few, NP    The overall AHI was only 8.3/h, indicating a mild degree of obstructive sleep apnea. This mild apnea is strongly positional and REM sleep dependent. REM AHI was 14.8/h. the RDI indicated mild to moderate snoring. Sleep related hypoxia was not found. More significant is the tachycardia finding in the setting of stimulant medication.  Recommendations:    The patient endorsed an Epworth score of 12 points while taking stimulant medications, working shifts and having mild OSA. CPAP can be helpful, but has in the past not made much of a difference.  REM dependent apnea usually is not responsive to a dental device.  I recommend to avoid the supine sleep position, consider a CPAP with an interface of her choice, heated humidity and autotitration capacity for snoring treatment and consider a switch from Adderall to either Vyvanse or Sunosi for prevention of tachycardia while helping with alertness.   Please schedule a visit with the patient.  Electronically Signed:  Larey Seat, MD   06-27-2019   cc Brien Few , NP.

## 2019-08-25 ENCOUNTER — Ambulatory Visit: Payer: BLUE CROSS/BLUE SHIELD | Admitting: Obstetrics & Gynecology

## 2019-09-07 ENCOUNTER — Ambulatory Visit: Payer: BLUE CROSS/BLUE SHIELD | Admitting: Adult Health

## 2019-11-06 ENCOUNTER — Ambulatory Visit: Payer: Self-pay | Admitting: Adult Health

## 2019-12-29 IMAGING — MG DIGITAL SCREENING BILATERAL MAMMOGRAM WITH TOMO AND CAD
8 series · 8 of 24 positions shown · non-contrast
Comparison: Previous exam(s).

CLINICAL DATA: Screening.

EXAM:
DIGITAL SCREENING BILATERAL MAMMOGRAM WITH TOMO AND CAD

[R MLO synth-2D]
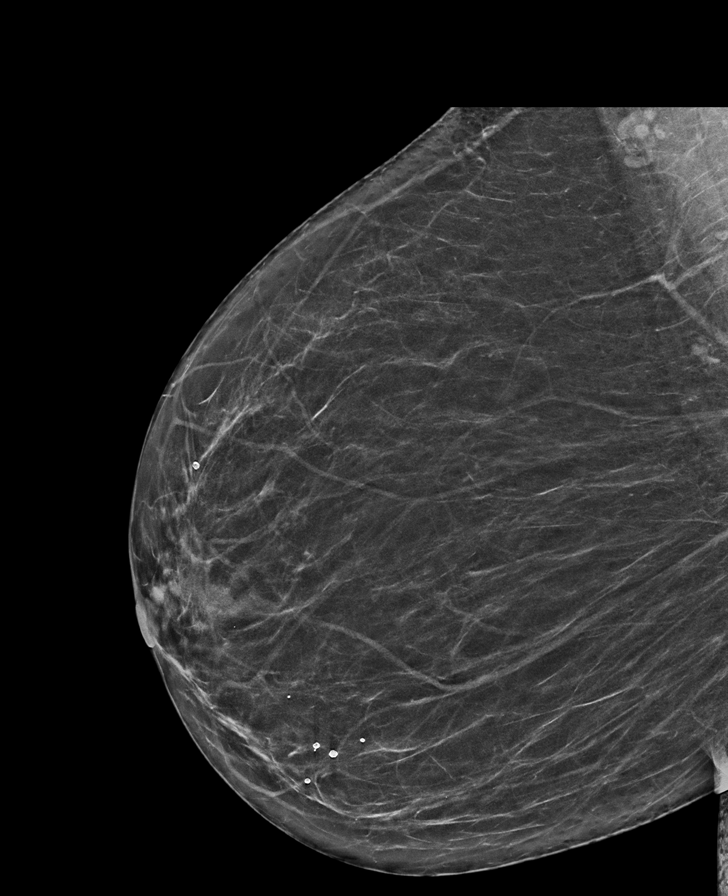

[R CC synth-2D]
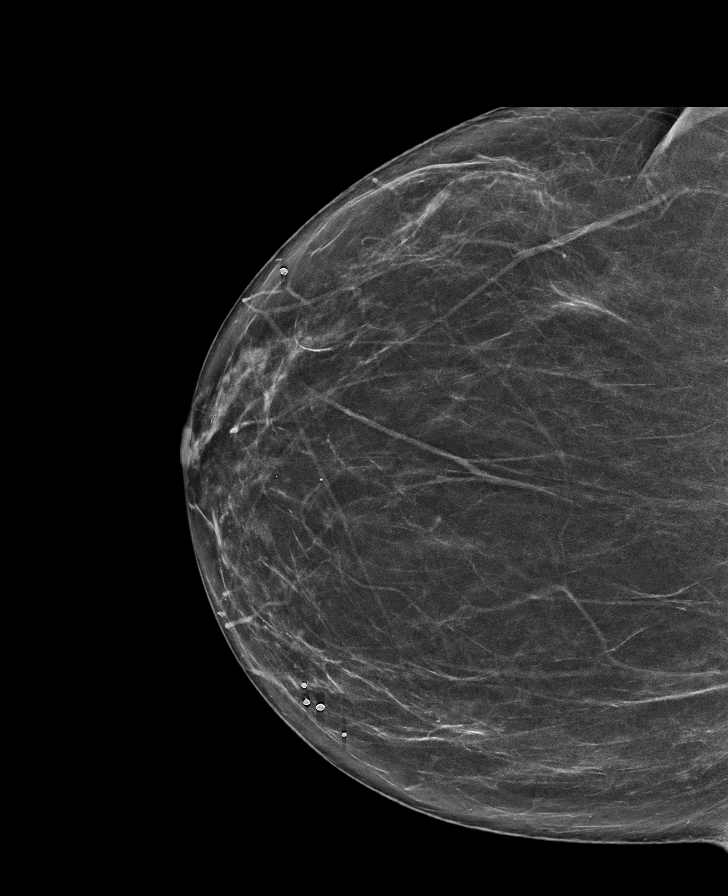

[L CC synth-2D]
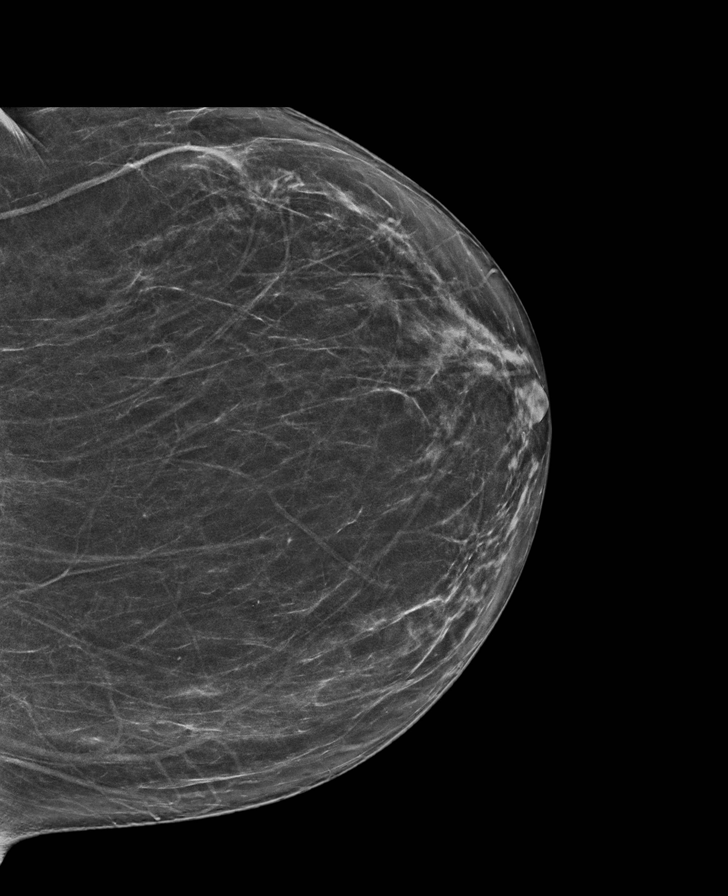

[L MLO synth-2D]
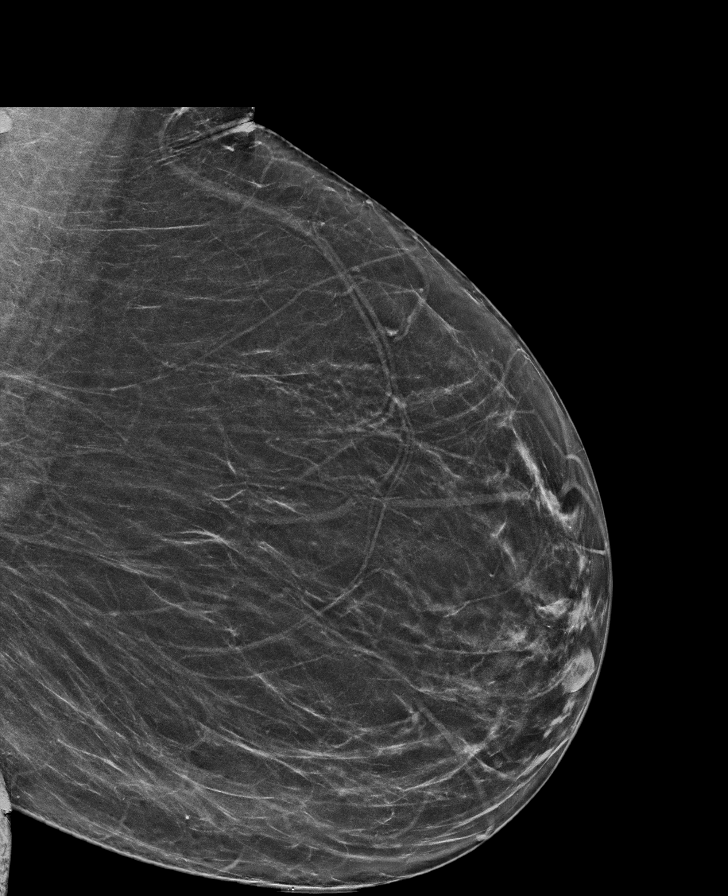

[L MLO tomo · tomo slice 35/70.0]
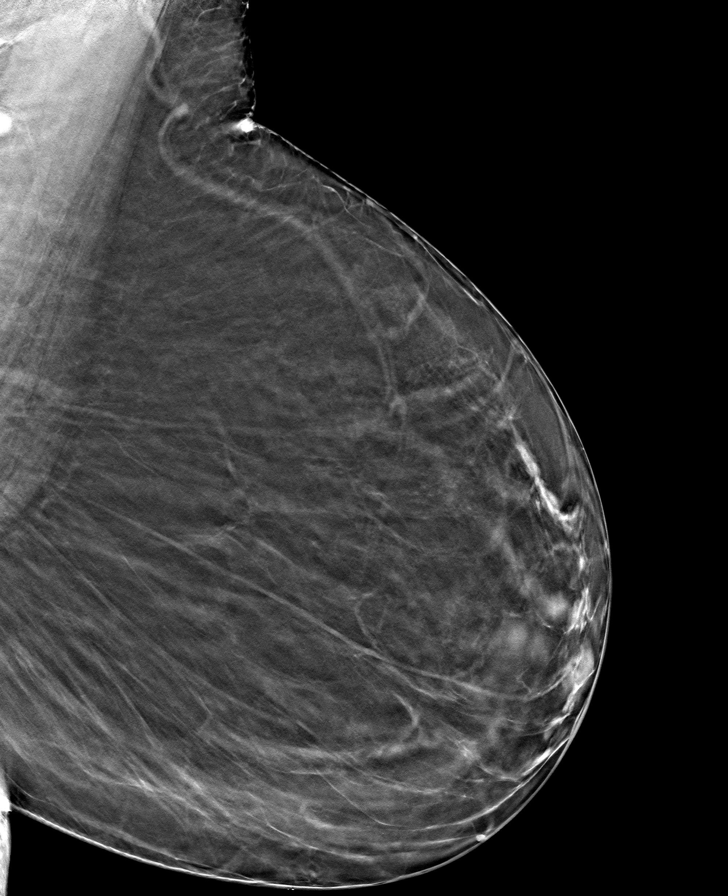

[R MLO tomo · tomo slice 36/71.0]
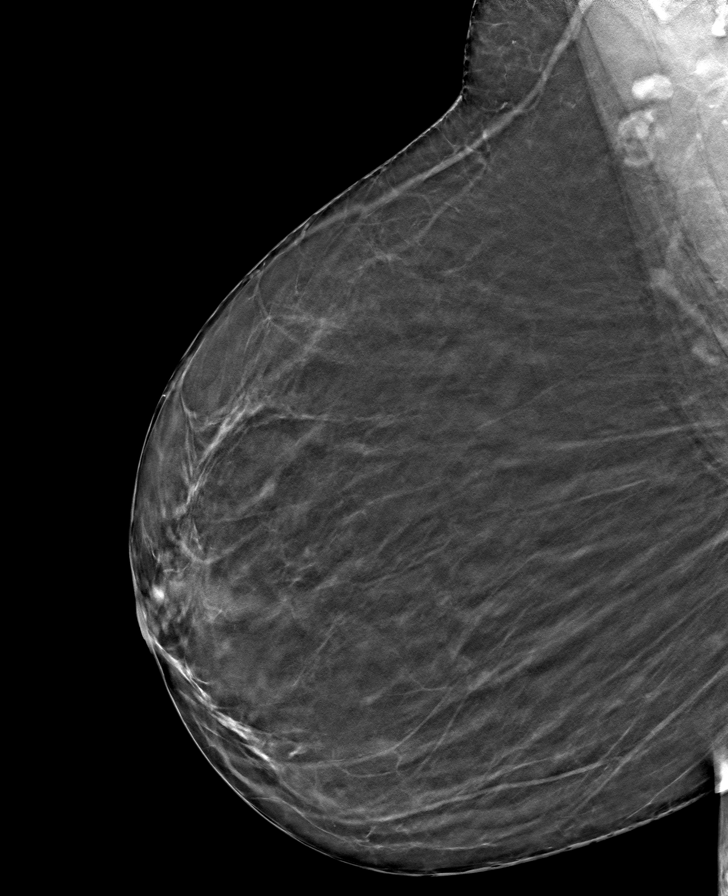

[R CC tomo · tomo slice 37/73.0]
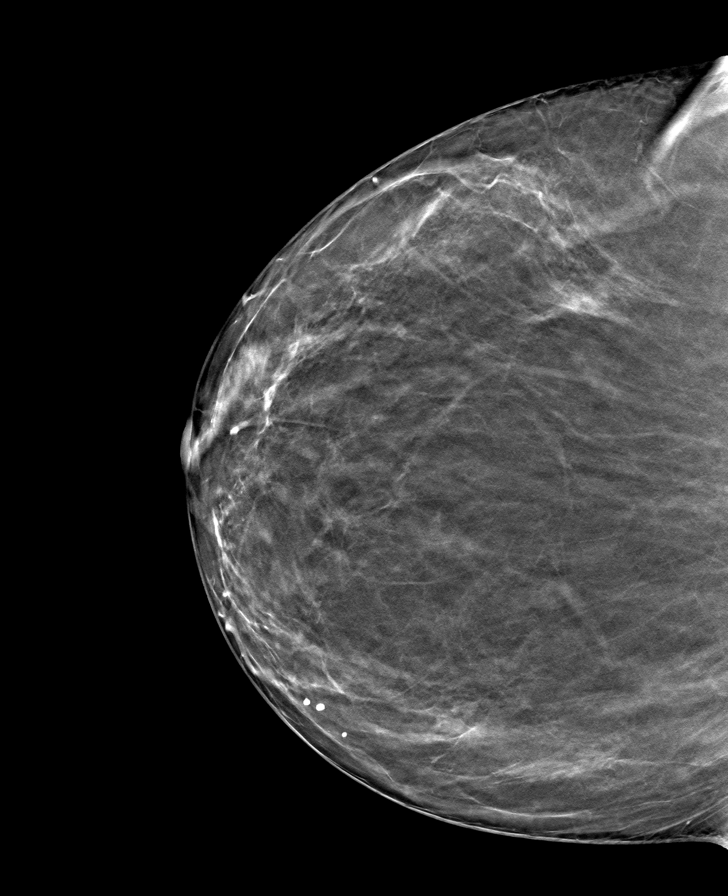

[L CC tomo · tomo slice 35/70.0]
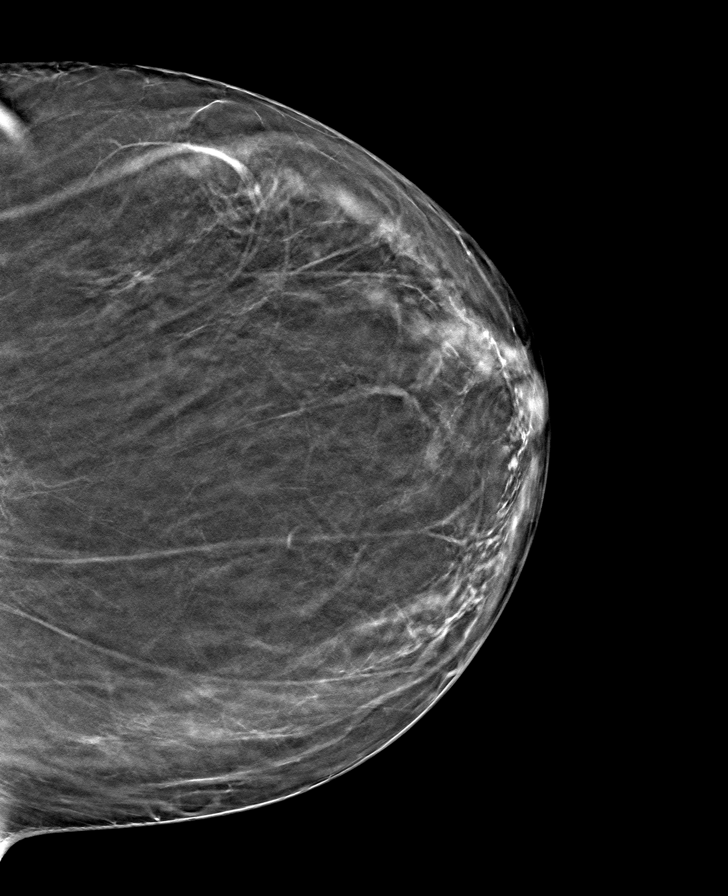

[8 of 24 positions shown; findings below may reference images not displayed]

ACR Breast Density Category b: There are scattered areas of
fibroglandular density.
FINDINGS: There are no findings suspicious for malignancy. Images were
processed with CAD.
IMPRESSION: No mammographic evidence of malignancy. A result letter of this
screening mammogram will be mailed directly to the patient.

RECOMMENDATION:
Screening mammogram in one year. (Code:[id number])

BI-RADS CATEGORY  1: Negative.

## 2020-02-24 ENCOUNTER — Ambulatory Visit: Payer: BLUE CROSS/BLUE SHIELD | Attending: Internal Medicine

## 2020-02-24 DIAGNOSIS — Z23 Encounter for immunization: Secondary | ICD-10-CM | POA: Insufficient documentation

## 2020-02-24 NOTE — Progress Notes (Signed)
   Covid-19 Vaccination Clinic  Name:  [name]    MRN: [id number] DOB: 04-12-1966  02/24/2020  [name] was observed post Covid-19 immunization for 15 minutes without incidence. She was provided with Vaccine Information Sheet and instruction to access the V-Safe system.   [name] was instructed to call 911 with any severe reactions post vaccine: Marland Kitchen Difficulty breathing  . Swelling of your face and throat  . A fast heartbeat  . A bad rash all over your body  . Dizziness and weakness    Immunizations Administered    Name Date Dose VIS Date Route   Pfizer COVID-19 Vaccine 02/24/2020  2:25 PM 0.3 mL 12/08/2019 Intramuscular   Manufacturer: [place]   Lot: [id number]   [place]: [id number]

## 2020-03-16 ENCOUNTER — Ambulatory Visit: Payer: BLUE CROSS/BLUE SHIELD | Attending: Internal Medicine

## 2020-03-16 DIAGNOSIS — Z23 Encounter for immunization: Secondary | ICD-10-CM

## 2020-03-16 NOTE — Progress Notes (Signed)
   Covid-19 Vaccination Clinic  Name:  Cynthia Ware    MRN: [id number] DOB: 06-22-1966  03/16/2020  Cynthia Ware was observed post Covid-19 immunization for 15 minutes without incident. She was provided with Vaccine Information Sheet and instruction to access the V-Safe system.   Cynthia Ware was instructed to call 911 with any severe reactions post vaccine: Marland Kitchen Difficulty breathing  . Swelling of face and throat  . A fast heartbeat  . A bad rash all over body  . Dizziness and weakness   Immunizations Administered    Name Date Dose VIS Date Route   Pfizer COVID-19 Vaccine 03/16/2020  1:16 PM 0.3 mL 12/08/2019 Intramuscular   Manufacturer: [place]   Lot: [id number]   [place]: [id number]
